# Patient Record
Sex: Female | Born: 1990 | Race: White | Marital: Single | Smoking: Current every day smoker
Health system: Southern US, Community
[De-identification: ages and names within clinical notes are randomized; demographics above are authoritative.]

## PROBLEM LIST (undated history)

## (undated) DIAGNOSIS — F988 Other specified behavioral and emotional disorders with onset usually occurring in childhood and adolescence: Secondary | ICD-10-CM

## (undated) DIAGNOSIS — O26892 Other specified pregnancy related conditions, second trimester: Secondary | ICD-10-CM

## (undated) DIAGNOSIS — R700 Elevated erythrocyte sedimentation rate: Secondary | ICD-10-CM

## (undated) DIAGNOSIS — G4700 Insomnia, unspecified: Secondary | ICD-10-CM

## (undated) DIAGNOSIS — F410 Panic disorder [episodic paroxysmal anxiety] without agoraphobia: Secondary | ICD-10-CM

## (undated) DIAGNOSIS — F419 Anxiety disorder, unspecified: Secondary | ICD-10-CM

## (undated) DIAGNOSIS — Z0182 Encounter for allergy testing: Secondary | ICD-10-CM

## (undated) DIAGNOSIS — Z77120 Contact with and (suspected) exposure to mold (toxic): Secondary | ICD-10-CM

## (undated) DIAGNOSIS — R4020 Unspecified coma: Secondary | ICD-10-CM

## (undated) DIAGNOSIS — K219 Gastro-esophageal reflux disease without esophagitis: Secondary | ICD-10-CM

---

## 2008-08-13 LAB — OB PROFILE BSHR
ABS. EOSINOPHILS: 0 10*3/uL (ref 0.0–0.4)
ABS. LYMPHOCYTES: 1.7 10*3/uL (ref 0.8–3.5)
ABS. MONOCYTES: 0.4 10*3/uL (ref 0–1.0)
ABS. NEUTROPHILS: 5.3 10*3/uL (ref 1.8–8.0)
BASOPHILS: 0 % (ref 0–3)
EOSINOPHILS: 1 % (ref 0–5)
HCT: 35.3 % — ABNORMAL LOW (ref 36.0–46.0)
HGB: 11.9 g/dL — ABNORMAL LOW (ref 12.0–16.0)
Hep B surface Ag Interp.: NEGATIVE
Hepatitis B surface Ag: <0.1 Index (ref <1.00)
LYMPHOCYTES: 23 % (ref 20–51)
MCH: 31 PG (ref 25.0–35.0)
MCHC: 33.8 g/dL (ref 31.0–37.0)
MCV: 91.6 FL (ref 78.0–102.0)
MONOCYTES: 6 % (ref 2–9)
MPV: 8.5 FL (ref 7.4–10.4)
NEUTROPHILS: 70 % (ref 42–75)
PLATELET: 212 10*3/uL (ref 130–400)
RBC: 3.86 M/uL — ABNORMAL LOW (ref 4.10–5.10)
RDW: 14.7 % — ABNORMAL HIGH (ref 11.5–14.5)
RPR: NONREACTIVE
Rubella IgG, QL: IMMUNE
WBC: 7.5 10*3/uL (ref 4.5–13.0)

## 2008-08-13 LAB — OB BLOOD BANK
ABO/Rh(D): O POS
Antibody screen: NEGATIVE

## 2008-08-13 LAB — CHLAMYDIA/GC PCR
Chlamydia amplified: NEGATIVE
N. gonorrhea, amplified: NEGATIVE

## 2008-08-14 LAB — CULTURE, URINE: Culture result:: 80000

## 2008-09-23 LAB — DRUG SCREEN, URINE
AMPHETAMINES: NEGATIVE
BARBITURATES: POSITIVE — AB
BENZODIAZEPINES: NEGATIVE
COCAINE: NEGATIVE
METHADONE: NEGATIVE
OPIATES: NEGATIVE
PCP(PHENCYCLIDINE): NEGATIVE
THC (TH-CANNABINOL): NEGATIVE

## 2008-10-10 LAB — STREP THROAT SCREEN: Strep Screen: NEGATIVE

## 2008-11-09 LAB — URINALYSIS W/ RFLX MICROSCOPIC
Bilirubin: NEGATIVE
Blood: NEGATIVE
Glucose: NEGATIVE MG/DL
Ketone: NEGATIVE MG/DL
Nitrites: NEGATIVE
Protein: NEGATIVE MG/DL
Specific gravity: 1.02 (ref 1.003–1.030)
Urobilinogen: 0.2 EU/DL (ref 0.2–1.0)
pH (UA): 7 (ref 5.0–8.0)

## 2008-11-09 LAB — URINE MICROSCOPIC ONLY
RBC: 0 /HPF (ref 0–5)
WBC: 1 /HPF (ref 0–4)

## 2008-11-11 LAB — CHLAMYDIA/GC PCR
Chlamydia amplified: NEGATIVE
N. gonorrhea, amplified: NEGATIVE

## 2008-11-25 LAB — POC URINE MACROSCOPIC
Bilirubin (POC): NEGATIVE
Blood (POC): NEGATIVE
Glucose, urine (POC): NEGATIVE mg/dL
Ketones (POC): NEGATIVE mg/dL
Leukocyte esterase (POC): NEGATIVE
Nitrite (POC): NEGATIVE
Protein (POC): 100 mg/dL — AB
Spec. gravity (POC): 1.025 (ref 1.003–1.030)
Urobilinogen (POC): 1 EU/dL (ref 0.2–1.0)
pH, urine  (POC): 7.5 (ref 5.0–8.0)

## 2008-11-25 LAB — CBC WITH AUTOMATED DIFF
ABS. EOSINOPHILS: 0 10*3/uL (ref 0.0–0.4)
ABS. LYMPHOCYTES: 2 10*3/uL (ref 0.8–3.5)
ABS. MONOCYTES: 0.6 10*3/uL (ref 0–1.0)
ABS. NEUTROPHILS: 4.2 10*3/uL (ref 1.8–8.0)
BASOPHILS: 0 % (ref 0–3)
EOSINOPHILS: 1 % (ref 0–5)
HCT: 32.1 % — ABNORMAL LOW (ref 36.0–46.0)
HGB: 11.1 g/dL — ABNORMAL LOW (ref 12.0–16.0)
LYMPHOCYTES: 29 % (ref 20–51)
MCH: 31.7 PG (ref 25.0–35.0)
MCHC: 34.5 g/dL (ref 31.0–37.0)
MCV: 91.8 FL (ref 78.0–102.0)
MONOCYTES: 9 % (ref 2–9)
MPV: 8.3 FL (ref 7.4–10.4)
NEUTROPHILS: 61 % (ref 42–75)
PLATELET: 136 10*3/uL (ref 130–400)
RBC: 3.49 M/uL — ABNORMAL LOW (ref 4.10–5.10)
RDW: 14.3 % (ref 11.5–14.5)
WBC: 6.9 10*3/uL (ref 4.5–13.0)

## 2008-11-25 LAB — METABOLIC PANEL, COMPREHENSIVE
A-G Ratio: 0.6 — ABNORMAL LOW (ref 0.8–1.7)
ALT (SGPT): 28 U/L — ABNORMAL LOW (ref 30–65)
AST (SGOT): 19 U/L (ref 15–37)
Albumin: 2.1 g/dL — ABNORMAL LOW (ref 3.4–5.0)
Alk. phosphatase: 143 U/L — ABNORMAL HIGH (ref 50–136)
Anion gap: 5 mmol/L (ref 5–15)
BUN/Creatinine ratio: 11 — ABNORMAL LOW (ref 12–20)
BUN: 10 MG/DL (ref 7–18)
Bilirubin, total: 0.2 MG/DL (ref 0.1–0.9)
CO2: 25 MMOL/L (ref 21–32)
Calcium: 8.2 MG/DL — ABNORMAL LOW (ref 8.4–10.4)
Chloride: 108 MMOL/L (ref 100–108)
Creatinine: 0.9 MG/DL (ref 0.6–1.3)
GFR est AA: >60 mL/min/{1.73_m2} (ref >60)
GFR est non-AA: >60 mL/min/{1.73_m2} (ref >60)
Globulin: 3.5 g/dL (ref 2.0–4.0)
Glucose: 75 MG/DL (ref 74–99)
Potassium: 4.1 MMOL/L (ref 3.5–5.5)
Protein, total: 5.6 g/dL — ABNORMAL LOW (ref 6.4–8.2)
Sodium: 138 MMOL/L (ref 136–145)

## 2008-11-25 LAB — AMYLASE: Amylase: 39 U/L (ref 25–115)

## 2008-11-25 LAB — DRUG SCREEN UR - NO CONFIRM
ACETAMINOPHEN: NEGATIVE
AMPHETAMINES: NEGATIVE
BARBITURATES: NEGATIVE
BENZODIAZEPINES: NEGATIVE
COCAINE: NEGATIVE
METHADONE: NEGATIVE
Methamphetamines: NEGATIVE
OPIATES: NEGATIVE
PCP(PHENCYCLIDINE): NEGATIVE
THC (TH-CANNABINOL): POSITIVE — AB
TRICYCLICS: NEGATIVE

## 2008-11-25 LAB — TROPONIN I: Troponin-I, QT: <0.04 NG/ML (ref 0.00–0.08)

## 2008-11-25 LAB — D DIMER: D DIMER: 0.92 ug/ml(FEU) — ABNORMAL HIGH (ref <0.46)

## 2008-11-25 LAB — LIPASE: Lipase: 115 U/L (ref 73–393)

## 2008-11-25 LAB — URIC ACID: Uric acid: 5.5 MG/DL (ref 2.6–7.2)

## 2008-11-25 LAB — LD: LD: 134 U/L (ref 100–190)

## 2008-12-01 NOTE — ED Provider Notes (Signed)
[facility]                      EMERGENCY DEPARTMENT TREATMENT REPORT   NAME:  Lacey [name], Lacey [name]               SEX:            F   DATE:  12/01/2008                     DOB:            10-May-1990   MR#    [id number]                       TIME SEEN        6:42 A   ACCT#  [id number]                      ROOM:           [id number]   cc:    Hubert Azure, M.D.   PRIMARY CARE PHYSICIAN   Lynelle Smoke, MD   CHIEF COMPLAINT   Possible seizure.   HISTORY OF PRESENT ILLNESS   [age]-year-old female who is 6 months pregnant presents to the emergency   department by EMS.  EMS states that they received 2 calls to the patient's   trailer tonight.   The first was a third party call from her mother who was   not present on their arrival.  The mother apparently was concerned that the   patient overdosed.  When they arrived the patient stated she was taking   Vicodin and Motrin as prescribed by her doctor for costochondritis and did   not want to go with them. They were called back tonight by her.  She called   because she was vomiting.  The patient also tells me that she has had a   headache all day.  Her blood pressure on their arrival was 165/109 with no   history of hypertension.  Apparently when they were helping her out to the   ambulance, they state that she had about 1 minute of what they described as   seizure activity after falling to the ground, but there is no postictal   phase.  No history of seizures.   REVIEW OF SYSTEMS   CONSTITUTIONAL:  No fever.   EYES: No visual symptoms.   ENT: No sore throat, runny nose or other URI symptoms.   RESPIRATORY:  Has a cough.   CARDIOVASCULAR:  States that her chest is sore.   GASTROINTESTINAL:  Vomiting.  No diarrhea, no abdominal pain.   GENITOURINARY:  No dysuria, frequency, or urgency.   MUSCULOSKELETAL:  No joint pain or swelling.   INTEGUMENTARY:  No rashes.   NEUROLOGICAL:  Headache, questionable seizure.   PAST MEDICAL HISTORY    Bipolar, ADHD.   SOCIAL HISTORY   Smokes.   FAMILY HISTORY   Noncontributory.   ALLERGIES   None.   MEDICATIONS   Vicodin.   PHYSICAL EXAMINATION   VITALS:  Blood pressure 147/93, pulse 74, respiratory rate 11, temperature   97.8, O2 saturation 99% on room air, pain 10/10.   GENERAL APPEARANCE:  The patient appears well developed and well nourished.   Appearance and behavior are age and situation appropriate.   HEENT:  Eyes:  Conjunctivae clear, lids normal.  Pupils equal, symmetrical,   and normally reactive. Mouth/Throat:  Surfaces of the pharynx, palate, and   tongue are pink, moist, and without lesions.   NECK:  Supple, symmetrical, trachea midline.   LYMPHATIC:  No cervical or submandibular lymphadenopathy palpated.   RESPIRATORY:  Clear and equal breath sounds.  No respiratory distress,   tachypnea, or accessory muscle use.   CARDIOVASCULAR:  Regular rate and rhythm.   GASTROINTESTINAL:  Abdomen soft, nontender, without complaint of pain to   palpation.  No hepatomegaly or splenomegaly.   MUSCULOSKELETAL:  Nails:  No clubbing or deformities.  Nail beds are pink   with prompt capillary refill.   SKIN:  Warm and dry without rashes.   PSYCHIATRIC:  Recent and remote memory appear to be intact.   NEUROLOGICAL:  No focal deficits.   CONTINUATION BY DR. Jorja Loa NELSON   DIAGNOSTIC IMPRESSION   I saw the patient Physician's Assistant Ms. Woolard.  The patient was seen   in [facility] Emergency Department the 27th and the 30th, had a gallbladder   ultrasound, a CT scan of her chest and a cardiology consultation on the   first visit.  Today, EMS was activated because she had been vomiting and   complaining of headache.  On arrival, she was hypertensive, was concerned   about eclampsia.  There are some questions  whether she may have had a   seizure prehospital, although there is no documented postictal state.   Paramedics described about a minute of twitching activity.  Once here in    the emergency department, she proceeded to have another seizure that lasted   for about a minute or less.  She had generalized tonic-clonic activity.   She was, indeed, postictal after the seizure.  EMS had been at her house   earlier in the day because her mother thought she might have overdosed on   her pain medicine and the patient requests we do a Tylenol level to prove   that she has not overdosed.  Her salicylate level was less than 1.7, which   is negative.  Alcohol screen is negative.  Acetaminophen is 2.2 mcg per mL,   which is quite subtherapeutic.  Lipase is normal.  Comprehensive Metabolic   Panel reveals normal SGPT and SGOT.  Her alk phos is slightly elevated at   149, LDH is, likewise, slightly elevated at 311.  Urinalysis has greater   than 300 protein, large blood, positive nitrites.  Urine culture is   pending.  Urine drug screen is positive for opiates, benzodiazepines and   marijuana as well as acetaminophen.  Urine microscopy shows 1-4 white   cells, too numerous to count red cells and 1+ bacteria; a culture is   pending.  I gave the patient 4 grams of magnesium over 30 minutes.   Concerned with proteinuria, elevated blood pressure, seizure and elevated   LDH that this might have become eclampsia.  I spoke with Dr. Maryruth Hancock as   well as the hospitalist, Dr. Shauna Hugh.  Dr. Maryruth Hancock agrees to accept   the patient.  Dr. Sherlon Handing will be evaluating as a consultant's role.   DIAGNOSES   1. Eclampsia   2. Cephalgia.   3. Nausea and vomiting.   PLAN   Admission to the antepartum unit on Dr. Isabel Caprice service.   ADDENDUM BY DR. TODD VANDEN HOEK   0913 hours.   The patient was admitted to the hospitalist service with Dr. Maryruth Hancock on   consult.  The head CT over read by   Dr. Van Clines  showed areas of hyperattenuation involving the white matter   of both posterior parietal lobes, greater on the right.  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  Consider MRI for additional evaluation.  This report was   discussed with Dr. Leighton Roach on the hospitalist service who will relate to the   hospitalist taking care of this patient.   ____________________________   Marijo Sanes, M.D.   Dictated By:  Salem Caster, P.A.   My signature above authenticates this document and my orders, the final   diagnosis(es), discharge prescription(s) and instructions in the [facility] record.   sb1  D:  12/01/2008  T:  12/01/2008  8:06 A   [id number]

## 2008-12-01 NOTE — Consults (Signed)
[facility]                               CONSULTATION REPORT                     CONSULTANT:  ANNA Blenda Nicely, M.D.   NAMENeita [name], [place]                      CONSULT  DATE:       12/01/2008   ADMIT 12/01/2008                            SEX:                 F   DATE:   MR#   [id number]                              DOB:                 12/18/1990   ACCT# [id number]                             LOCATION:            [id number]   cc:    Hubert Azure, M.D.          ANNA Blenda Nicely, M.D.   REASON FOR CONSULTATION   This patient is a 18-year Caucasian lady who is [redacted] weeks pregnant.  She   presented to the emergency room because of persistent nausea and vomiting.   The patient was found to be hypertensive with proteinuria and had two   witnessed generalized tonic-clonic seizures, one witnessed by paramedics on   the way here and one witnessed at the emergency room, seen by Dr. Delton See.   The patient has a questionable urinary tract infection and also has a urine   drug screen which is positive for benzos, opiates and cannabis.  The   patient is being admitted under the OB/GYN service and we are following for   medical management.   PROBLEMS IDENTIFIED   1. Hypertension which is uncontrolled but apparently new.   2. Generalized tonic-clonic seizures.   3. Proteinuria.   4. Urinary tract infection.   5. Nausea, vomiting.   6. Moderate malnutrition.   7. Mild leukocytosis.   8. History of herpes.   9. History of depression   10. History of drug abuse.  She denies any others except for marijuana.      She used to smoke but quit since she became pregnant.  She denies any      alcohol use.   CURRENT RECOMMENDATIONS   Would treat the hypertension as her blood pressures have been normal in the   past and this is all new.  Her blood pressures have been ranging in the   160s systolic and 100s diastolic.  Would defer to OB/GYN as to the choice    of hypertensive medications.  Would also treat the underlying possible   urinary tract infection and follow cultures.  Adjust antibiotics as needed.   I would recommend amoxicillin or Keflex.  Would treat this nausea and   vomiting symptomatically.  Will defer to OB/GYN regarding management of the   seizures accompanying the  eclampsia.   The patient had already been   counseled on smoking cessation and the use of marijuana.  She said she has   not smoked since she became pregnant, but the urine drug screen is positive   for the drug.  Would recommend sequential TEDs for DVT prophylaxis and   recommend to continue the antidepressant as the patient seems to be at high   risk for significant depression.   HISTORY OF PRESENT ILLNESS   This is a 18 year old Caucasian lady who is [redacted] weeks pregnant.  She had   just presented to [facility] a couple of days ago because of chest   pain that started upon waking up.  She was diagnosed there to have   costochondritis after workup consisting of a CT scan of the chest and   ultrasound of the right upper quadrant.  A cardiology consult was made.   All the tests apparently were negative.  She was sent home on Vicodin and   was also given Xanax by her primary care doctor.  The patient was not very   clear as to what happened in between, but on the day of admission here, she   was noted by some neighbors to be vomiting a lot.  Paramedics were summoned   and she was sent over here.  Based on the paramedic's note, there was a   question of whether she had been overdosing on drugs.  Apparently when they   saw her, she was walking to the ambulance when she had a generalized   tonic-clonic seizure that was witnessed by the paramedics.  It was not   clear how long the postictal phase was, but apparently the patient had   recovered her baseline mental status by the time she arrived at the ER.   She was being evaluated here when she had another episode of generalized    tonic-clonic seizures, this time witnessed by Dr. Marijo Sanes who was   the emergency room physician.  She was given some magnesium and the patient   is being admitted now to OB/GYN with [place] in consult.  The patient denied   having any history of seizures before.  She said she felt a little bit   feverish for the past day or two and a little bit chilly.  She denied   having any dysuria until the day she went to [facility], but she cannot   remember if she was catheterized there either.  She denied having any   nausea and vomiting until today.  She does have some low back pain but that   is chronic since she became pregnant.  She says she has not been sexually   active since she became pregnant.  She denies any vaginal discharge.   REVIEW OF SYSTEMS   A 12-point review of systems was done and is essentially negative except   for the above-mentioned symptoms.   ALLERGIES   No known drug allergies.   IMMUNIZATION HISTORY   None.   PAST MEDICAL HISTORY   See above list.   MEDICATIONS   She apparently is on Zoloft and BuSpar, but she is not sure if she still   taking that.  She definitely is taking Vicodin 5/325 mg 1 tablet every 4   hours as needed for pain and prenatal vitamins which she admits she does   not take on a regular basis.   She also takes Xanax as needed for anxiety.   FAMILY  HISTORY   Her father just recently committed suicide.  Her mother is alive but has a   history of toxemia of pregnancy according to the patient.  She also has   hypertension.   SOCIAL HISTORY   She is single.  She currently lives with a roommate.  She plans to attend   school in the fall.  Her mother is basically her main source of support as   she is unemployed.   PHYSICAL EXAMINATION   GENERAL:  She is an obese young Caucasian lady not in apparent distress.   VITAL SIGNS:  Blood pressure 147/93, pulse rate 74, respiratory rate 11,   temperature 97.8 and O2 sats 99% on room air.    HEENT:  Normocephalic, atraumatic.  Pink conjunctivae, anicteric sclerae.   Normal ears and nose.  Clear oropharynx.  No thrush.   NECK:  Neck is supple.  No JVD, thyromegaly or bruit.   CHEST AND LUNGS:  Symmetric expansion.  Clear to auscultation.   HEART:  Normal rate, regular rhythm.  No murmur, gallop or rub.   ABDOMEN:  Gravid.  Positive bowel sounds, soft, no guarding or rebound.  No   tenderness.   EXTREMITIES:  She has trace ankle edema but no leg tenderness and no   cyanosis or clubbing.   SKIN:  She has multiple tattoos but no rashes.  No cyanosis.   NEUROLOGIC:  She is alert and oriented x3, moving all four extremities.   Appears to have intact gross sensation.   LABORATORY DATA   Her BMP is remarkable for an alkaline phosphatase 149, albumin of 2.5,   total protein of 6 and CO2 of 20.  Creatinine is normal.  CBC shows a white   cell count of 5.7 with a normal hemoglobin and platelet count.  Urinalysis   shows 100 glucose, a moderate amount of bilirubin, 15 ketones, a large   amount of blood, protein greater than 300 and nitrites were positive.  This   was from a catheterized specimen.  Her urine drug screen was positive for   opiates, benzodiazepines, marijuana and acetaminophen.  The urine also   showed some waxy casts.  A head CT does not show any acute abnormality.   ASSESSMENT/PLAN   As above.   RECOMMENDATIONS   As above.   Thank you very much Dr. Maryruth Hancock for allowing [place] to participate in her care.   Our group will follow with you.   ____________________________   Barrie Folk, M.D.   Dictated By:   lo  D:  12/01/2008  T:  12/01/2008  3:16 P   [id number]

## 2008-12-09 NOTE — ED Provider Notes (Signed)
[facility]                      EMERGENCY DEPARTMENT TREATMENT REPORT   NAME:  [place], [place]               SEX:            F   DATE:  12/01/2008                     DOB:            1990/12/30   MR#    [id number]                       TIME SEEN        9:39 P   ACCT#  [id number]                      ROOM:           [id number]   cc:                                    ADDENDUM   ADDENDUM BY Marijo Sanes, M.D.   PROCEDURE   Critical care time, excluding procedures, but  including direct patient   care, reviewing medical record, evaluating results of diagnostic testing,   discussions with family members, and consulting with physicians:  40   minutes.   ____________________________   Marijo Sanes, M.D.   Dictated By:   My signature above authenticates this document and my orders, the final   diagnosis(es), discharge prescription(s) and instructions in the [facility] record.   pb  D:  12/09/2008  T:  12/09/2008 10:30 P   [id number]

## 2008-12-13 NOTE — ED Provider Notes (Signed)
[facility]                      EMERGENCY DEPARTMENT TREATMENT REPORT   NAME:  [place], [place]               SEX:            F   DATE:  12/13/2008                     DOB:            1991/01/15   MR#    [id number]                       TIME SEEN        5:34 A   ACCT#  [id number]                      ROOM:           ER  [id number]   cc:   Time seen: 1610   CHIEF COMPLAINT   Headache.   HISTORY OF PRESENT ILLNESS   This is a 18 year old female who presents to the emergency department   complaining of headache.  She delivered early on December 01, 2008 after being   admitted for preeclampsia.  She was [redacted] week gestation.  She states that she   was discharged from the hospital on Saturday and has been having headache   since that time, but this one is slightly worse than the one she has been   experiencing.  The headache is right temporal and frontal.  She states that   she has been missing doses of her blood pressure medications, which are   labetalol and nifedipine.  She did have an epidural with her delivery but   has not complained of a positional change with her headache intensity.   REVIEW OF SYSTEMS   CONSTITUTIONAL: No fever.   EYES: No visual symptoms.   ENT: No sore throat, runny nose or other URI symptoms.   RESPIRATORY:  No cough, shortness of breath, or wheezing.   CARDIOVASCULAR:  No chest pain, chest pressure, or palpitations.   GASTROINTESTINAL:  No vomiting, diarrhea, or abdominal pain.   GENITOURINARY:  No dysuria, frequency, or urgency.   MUSCULOSKELETAL:  No joint pain or swelling.   INTEGUMENTARY:  No rashes.   NEUROLOGICAL: Headache.   PAST MEDICAL HISTORY   Eclampsia, cesarean section, bipolar, ADHD.   SOCIAL HISTORY   Here alone.  Smoker.   FAMILY HISTORY   Noncontributory.   ALLERGIES   None.   MEDICATIONS   Reviewed in [facility].   PHYSICAL EXAMINATION   VITAL SIGNS: Blood pressure 150/96, pulse 108, respiratory rate 16,   temperature 98.6, pain 10/10.    GENERAL APPEARANCE:  The patient appears well developed and well nourished.   Appearance and behavior are age and situation appropriate.   HEENT: Eyes:  Conjunctivae clear, lids normal.  Pupils equal, symmetrical,   and normally reactive. Mouth/Throat:  Surfaces of the pharynx, palate, and   tongue are pink, moist, and without lesions.   NECK:  Supple and symmetrical. Trachea midline.   LYMPHATICS:  No cervical or submandibular lymphadenopathy palpated.   RESPIRATORY:  Clear and equal breath sounds.  No respiratory distress,   tachypnea, or accessory muscle use.   HEART:   Regular rate and rhythm.   GASTROINTESTINAL:  Abdomen soft, nontender, without  complaint of pain to   palpation.  No hepatomegaly or splenomegaly.   MUSCULOSKELETAL:  Stance and gait appear normal.   SKIN:  Warm and dry without rashes.   PSYCHIATRIC:   Recent and remote memory appear to be intact.   NEUROLOGICAL: No focal deficits.   CONTINUATION BY Salem Caster, PA-C:   INITIAL ASSESSMENT AND MANAGEMENT PLAN   A 18 year old female presents with complaints of headache.  She states that   she has been having them since she came home from the hospital after her   emergency C-section for eclampsia.  We are going to check liver enzymes and   urinalysis for protein.  We will medicate with Compazine and Benadryl.   DIAGNOSTIC STUDIES   Urinalysis with greater than 300 protein, but she does have small blood in   her urine.  Her CMP is unremarkable with no liver enzyme elevation.  Her   CBC is unremarkable save mild anemia with a hemoglobin of 11 and hematocrit   32.5.   COURSE IN THE EMERGENCY DEPARTMENT   Recheck after Compazine and Benadryl, she still had a slight headache.  She   was given 4 mg of morphine and 4 mg of Zofran.  At this time, on recheck,   she is improved.  Blood pressure is 131/71.   DIAGNOSIS   Cephalgia.   PLAN   1. The patient is discharged home in stable condition, with instructions to       follow up with their regular doctor.  They are advised to return      immediately for any worsening or symptoms of concern.   2. Prescription written for Fioricet and Phenergan.  The patient is also      advised to take her blood pressure medications as prescribed.  May      return if new or worsening symptoms.   ____________________________   Imogene Burn, M.D.   Dictated By:  Salem Caster, P.A.   My signature above authenticates this document and my orders, the final   diagnosis(es), discharge prescription(s) and instructions in the [facility] record.   gm  D:  12/13/2008  T:  12/15/2008 11:18 A   [id number]

## 2008-12-31 LAB — CULTURE, BODY FLUID W GRAM STAIN
Culture result:: NO GROWTH
Culture result:: NO GROWTH

## 2009-07-22 LAB — HCG QL SERUM: HCG, Ql.: NEGATIVE

## 2009-07-24 LAB — CBC WITH AUTOMATED DIFF
ABS. BASOPHILS: 0 10*3/uL (ref 0.0–0.1)
ABS. EOSINOPHILS: 0 10*3/uL (ref 0.0–0.4)
ABS. LYMPHOCYTES: 1.4 10*3/uL (ref 0.8–3.5)
ABS. MONOCYTES: 0.7 10*3/uL (ref 0–1.0)
ABS. NEUTROPHILS: 4.7 10*3/uL (ref 1.8–8.0)
BASOPHILS: 0 % (ref 0–3)
EOSINOPHILS: 1 % (ref 0–5)
HCT: 33.7 % — ABNORMAL LOW (ref 36.0–46.0)
HGB: 10.9 g/dL — ABNORMAL LOW (ref 12.0–16.0)
LYMPHOCYTES: 21 % (ref 20–51)
MCH: 28.3 PG (ref 25.0–35.0)
MCHC: 32.4 g/dL (ref 31.0–37.0)
MCV: 87.2 FL (ref 78.0–102.0)
MONOCYTES: 11 % — ABNORMAL HIGH (ref 2–9)
MPV: 8.9 FL (ref 7.4–10.4)
NEUTROPHILS: 67 % (ref 42–75)
PLATELET: 178 10*3/uL (ref 130–400)
RBC: 3.86 M/uL — ABNORMAL LOW (ref 4.10–5.10)
RDW: 15.6 % — ABNORMAL HIGH (ref 11.5–14.5)
WBC: 6.8 10*3/uL (ref 4.5–13.0)

## 2009-07-24 LAB — INFLUENZA A & B AG (RAPID TEST)
Influenza A Antigen: NEGATIVE
Influenza B Antigen: NEGATIVE

## 2009-07-24 LAB — METABOLIC PANEL, BASIC
Anion gap: 11 mmol/L (ref 5–15)
BUN/Creatinine ratio: 13 (ref 12–20)
BUN: 13 MG/DL (ref 7–18)
CO2: 27 MMOL/L (ref 21–32)
Calcium: 9 MG/DL (ref 8.4–10.4)
Chloride: 105 MMOL/L (ref 100–108)
Creatinine: 1 MG/DL (ref 0.6–1.3)
GFR est AA: >60 mL/min/{1.73_m2} (ref >60)
GFR est non-AA: >60 mL/min/{1.73_m2} (ref >60)
Glucose: 78 MG/DL (ref 74–99)
Potassium: 4 MMOL/L (ref 3.5–5.5)
Sodium: 143 MMOL/L (ref 130–146)

## 2009-07-24 LAB — MONONUCLEOSIS SCREEN: Mononucleosis screen: NEGATIVE

## 2009-07-24 LAB — STREP THROAT SCREEN: Strep Screen: NEGATIVE

## 2009-09-11 LAB — CBC WITH AUTOMATED DIFF
ABS. BASOPHILS: 0 10*3/uL (ref 0.0–0.06)
ABS. EOSINOPHILS: 0.1 10*3/uL (ref 0.0–0.4)
ABS. LYMPHOCYTES: 2.7 10*3/uL (ref 0.9–3.6)
ABS. MONOCYTES: 0.5 10*3/uL (ref 0.05–1.2)
ABS. NEUTROPHILS: 3 10*3/uL (ref 1.8–8.0)
BASOPHILS: 1 % (ref 0–2)
EOSINOPHILS: 1 % (ref 0–5)
HCT: 35.8 % (ref 35.0–45.0)
HGB: 11.5 g/dL — ABNORMAL LOW (ref 12.0–16.0)
LYMPHOCYTES: 43 % (ref 21–52)
MCH: 27.4 PG (ref 24.0–34.0)
MCHC: 32.1 g/dL (ref 31.0–37.0)
MCV: 85.2 FL (ref 74.0–97.0)
MONOCYTES: 8 % (ref 3–10)
MPV: 10.2 FL (ref 9.2–11.8)
NEUTROPHILS: 47 % (ref 40–73)
PLATELET: 237 10*3/uL (ref 135–420)
RBC: 4.2 M/uL (ref 4.20–5.30)
RDW: 15.7 % — ABNORMAL HIGH (ref 11.6–14.5)
WBC: 6.2 10*3/uL (ref 4.6–13.2)

## 2009-11-15 LAB — DRUG SCREEN UR - NO CONFIRM
ACETAMINOPHEN: POSITIVE — AB
AMPHETAMINES: NEGATIVE
BARBITURATES: NEGATIVE
BENZODIAZEPINES: NEGATIVE
COCAINE: NEGATIVE
METHADONE: NEGATIVE
Methamphetamines: NEGATIVE
OPIATES: NEGATIVE
PCP(PHENCYCLIDINE): NEGATIVE
THC (TH-CANNABINOL): NEGATIVE
TRICYCLICS: NEGATIVE

## 2009-11-15 LAB — URINALYSIS W/ RFLX MICROSCOPIC
Bilirubin: NEGATIVE
Blood: NEGATIVE
Glucose: NEGATIVE MG/DL
Ketone: NEGATIVE MG/DL
Leukocyte Esterase: NEGATIVE
Nitrites: NEGATIVE
Protein: NEGATIVE MG/DL
Specific gravity: 1.025 (ref 1.003–1.030)
Urobilinogen: 0.2 EU/DL (ref 0.2–1.0)
pH (UA): 8 (ref 5.0–8.0)

## 2009-11-15 LAB — HCG URINE, QL: HCG urine, QL: NEGATIVE

## 2010-05-06 LAB — URINALYSIS W/ RFLX MICROSCOPIC
Bilirubin: NEGATIVE
Blood: NEGATIVE
Glucose: NEGATIVE MG/DL
Ketone: NEGATIVE MG/DL
Leukocyte Esterase: NEGATIVE
Nitrites: NEGATIVE
Protein: NEGATIVE MG/DL
Specific gravity: 1.025 (ref 1.003–1.030)
Urobilinogen: 0.2 EU/DL (ref 0.2–1.0)
pH (UA): 7.5 (ref 5.0–8.0)

## 2010-05-06 LAB — KOH, OTHER SOURCES: KOH: NONE SEEN

## 2010-05-06 LAB — WET PREP

## 2010-05-06 LAB — BETA HCG, QT: Beta HCG, QT: <1 m[IU]/mL — ABNORMAL LOW (ref 1–6)

## 2010-05-06 LAB — HCG URINE, QL: HCG urine, QL: NEGATIVE

## 2010-05-07 LAB — CHLAMYDIA/GC PCR
Chlamydia amplified: NEGATIVE
N. gonorrhea, amplified: NEGATIVE

## 2014-07-02 ENCOUNTER — Ambulatory Visit: Payer: Self-pay | Admitting: Family Medicine

## 2014-07-25 ENCOUNTER — Emergency Department: Payer: Self-pay | Admitting: Internal Medicine

## 2014-07-25 LAB — COMPREHENSIVE METABOLIC PANEL
ANION GAP: 7 (ref 7–16)
Albumin: 4.2 g/dL
Alkaline Phosphatase: 48 U/L
BILIRUBIN TOTAL: 0.3 mg/dL
BUN: 17 mg/dL
CREATININE: 0.96 mg/dL
Calcium, Total: 9.1 mg/dL
Chloride: 102 mmol/L
Co2: 31 mmol/L
EGFR (African American): >60
GLUCOSE: 89 mg/dL
POTASSIUM: 4 mmol/L
SGOT(AST): 18 U/L
SGPT (ALT): 12 U/L — ABNORMAL LOW
SODIUM: 140 mmol/L
Total Protein: 7.4 g/dL

## 2014-07-25 LAB — CBC WITH DIFFERENTIAL/PLATELET
BASOS ABS: 0 10*3/uL (ref 0.0–0.1)
BASOS PCT: 0.4 %
EOS PCT: 1.8 %
Eosinophil #: 0.1 10*3/uL (ref 0.0–0.7)
HCT: 40.2 % (ref 35.0–47.0)
HGB: 13.2 g/dL (ref 12.0–16.0)
LYMPHS PCT: 22.3 %
Lymphocyte #: 1.6 10*3/uL (ref 1.0–3.6)
MCH: 31.5 pg (ref 26.0–34.0)
MCHC: 32.9 g/dL (ref 32.0–36.0)
MCV: 96 fL (ref 80–100)
Monocyte #: 0.6 x10 3/mm (ref 0.2–0.9)
Monocyte %: 8 %
NEUTROS PCT: 67.5 %
Neutrophil #: 4.9 10*3/uL (ref 1.4–6.5)
Platelet: 228 10*3/uL (ref 150–440)
RBC: 4.2 10*6/uL (ref 3.80–5.20)
RDW: 14.7 % — ABNORMAL HIGH (ref 11.5–14.5)
WBC: 7.3 10*3/uL (ref 3.6–11.0)

## 2014-07-25 LAB — TROPONIN I: Troponin-I: <0.03 ng/mL

## 2014-12-05 ENCOUNTER — Emergency Department
Admission: EM | Admit: 2014-12-05 | Discharge: 2014-12-05 | Discharge status: Against Medical Advice | Payer: Medicaid Other

## 2014-12-05 NOTE — ED Notes (Signed)
Pt presents after sustaining injury to R upper arm at work after a box fell 4' to land on upper R arm. Pt has arm partially extended, elbow bent. Pt describes the sensation as both numb and painful. Pt has decided to return in a.m.

## 2014-12-24 ENCOUNTER — Emergency Department: Payer: Medicaid Other

## 2014-12-24 ENCOUNTER — Emergency Department
Admission: EM | Admit: 2014-12-24 | Discharge: 2014-12-24 | Disposition: A | Discharge status: Home / Self Care | Payer: Medicaid Other | Attending: Student | Admitting: Student

## 2014-12-24 DIAGNOSIS — R112 Nausea with vomiting, unspecified: Secondary | ICD-10-CM | POA: Diagnosis not present

## 2014-12-24 DIAGNOSIS — Z3202 Encounter for pregnancy test, result negative: Secondary | ICD-10-CM | POA: Insufficient documentation

## 2014-12-24 DIAGNOSIS — R1084 Generalized abdominal pain: Secondary | ICD-10-CM | POA: Diagnosis present

## 2014-12-24 DIAGNOSIS — R109 Unspecified abdominal pain: Secondary | ICD-10-CM

## 2014-12-24 DIAGNOSIS — R52 Pain, unspecified: Secondary | ICD-10-CM

## 2014-12-24 LAB — URINALYSIS COMPLETE WITH MICROSCOPIC (ARMC ONLY)
BACTERIA UA: NONE SEEN
GLUCOSE, UA: NEGATIVE mg/dL
Hgb urine dipstick: NEGATIVE
Leukocytes, UA: NEGATIVE
NITRITE: NEGATIVE
PROTEIN: 100 mg/dL — AB
Specific Gravity, Urine: 1.024 (ref 1.005–1.030)
pH: 8 (ref 5.0–8.0)

## 2014-12-24 LAB — BASIC METABOLIC PANEL
Anion gap: 11 (ref 5–15)
BUN: 15 mg/dL (ref 6–20)
CALCIUM: 9.3 mg/dL (ref 8.9–10.3)
CO2: 29 mmol/L (ref 22–32)
CREATININE: 1.21 mg/dL — AB (ref 0.44–1.00)
Chloride: 101 mmol/L (ref 101–111)
Glucose, Bld: 83 mg/dL (ref 65–99)
Potassium: 3.5 mmol/L (ref 3.5–5.1)
SODIUM: 141 mmol/L (ref 135–145)

## 2014-12-24 LAB — CBC WITH DIFFERENTIAL/PLATELET
BASOS ABS: 0.1 10*3/uL (ref 0–0.1)
Eosinophils Absolute: 0.1 10*3/uL (ref 0–0.7)
Eosinophils Relative: 2 %
HEMATOCRIT: 41.7 % (ref 35.0–47.0)
HEMOGLOBIN: 13.9 g/dL (ref 12.0–16.0)
Lymphs Abs: 2.7 10*3/uL (ref 1.0–3.6)
MCH: 31.2 pg (ref 26.0–34.0)
MCHC: 33.4 g/dL (ref 32.0–36.0)
MCV: 93.5 fL (ref 80.0–100.0)
Monocytes Absolute: 0.6 10*3/uL (ref 0.2–0.9)
Monocytes Relative: 7 %
NEUTROS ABS: 4.6 10*3/uL (ref 1.4–6.5)
Platelets: 240 10*3/uL (ref 150–440)
RBC: 4.46 MIL/uL (ref 3.80–5.20)
RDW: 13 % (ref 11.5–14.5)
WBC: 8.1 10*3/uL (ref 3.6–11.0)

## 2014-12-24 LAB — WET PREP, GENITAL
Clue Cells Wet Prep HPF POC: NONE SEEN
Trich, Wet Prep: NONE SEEN
YEAST WET PREP: NONE SEEN

## 2014-12-24 LAB — CHLAMYDIA/NGC RT PCR (ARMC ONLY)
Chlamydia Tr: NOT DETECTED
N GONORRHOEAE: NOT DETECTED

## 2014-12-24 LAB — HCG, QUANTITATIVE, PREGNANCY: hCG, Beta Chain, Quant, S: 1 m[IU]/mL (ref <5)

## 2014-12-24 LAB — TYPE AND SCREEN
ABO/RH(D): O POS
ANTIBODY SCREEN: NEGATIVE

## 2014-12-24 MED ORDER — SODIUM CHLORIDE 0.9 % IV BOLUS (SEPSIS)
1000.0000 mL | Freq: Once | INTRAVENOUS | Status: AC
  Administered 2014-12-24: 1000 mL via INTRAVENOUS

## 2014-12-24 NOTE — ED Provider Notes (Addendum)
[facility] Emergency Department Provider Note  ____________________________________________  Time seen: Approximately 6:23 PM  I have reviewed the triage vital signs and the nursing notes.   HISTORY  Chief Complaint No chief complaint on file.    HPI Dominique Lang is a 24 y.o. female with no chronic medical problems who presents for evaluation of diffuse abdomina cramping and vaginal spotting today. The patient reports that she is a G3 P2 at approximately 17 weeks estimated gestational age by last menstrual period. She reports that today she has had diffuse lower abdominal cramping. She had one episode of light spotting from her vagina which she noted when she wiped however this resolved. It was not enough to fill even a panty liner.  She had a few episodes of nonbloody nonbilious emesis today however tolerated [employer] in the waiting room and has been feeling better since arrival. She has not seen any OB/GYN this pregnancy. She reports that she was seen by a doctor 2 weeks ago and they told her that she had a negative pregnancy test however she reports that her "blood tests are always negative and they always have to do an ultrasound to know that I'm pregnant". She had an irregular period at the beginning of this month. No fevers or chills. No diarrhea. She reports that she is feeling a baby move in her abdomen just above her umbilicus.   No past medical history on file.  There are no active problems to display for this patient.   No past surgical history on file.  No current outpatient prescriptions on file.  Allergies Review of patient's allergies indicates no known allergies.  No family history on file.  Social History Social History  Substance Use Topics  . Smoking status: Not on file  . Smokeless tobacco: Not on file  . Alcohol Use: Not on file    Review of Systems Constitutional: No fever/chills Eyes: No visual changes. ENT:  No sore throat. Cardiovascular: Denies chest pain. Respiratory: Denies shortness of breath. Gastrointestinal: + abdominal pain.  + nausea, + vomiting.  No diarrhea.  No constipation. Genitourinary: Negative for dysuria. Musculoskeletal: Negative for back pain. Skin: Negative for rash. Neurological: Negative for headaches, focal weakness or numbness.  10-point ROS otherwise negative.  ____________________________________________   PHYSICAL EXAM:  Filed Vitals:   12/24/14 2022  BP: 119/79  Pulse: 88  Temp: 98.2 F (36.8 C)  TempSrc: Oral  SpO2: 95%     Constitutional: Alert and oriented. Well appearing and in no acute distress. Eyes: Conjunctivae are normal. PERRL. EOMI. Head: Atraumatic. Nose: No congestion/rhinnorhea. Mouth/Throat: Mucous membranes are moist.  Oropharynx non-erythematous. Neck: No stridor.   Cardiovascular: Normal rate, regular rhythm. Grossly normal heart sounds.  Good peripheral circulation. Respiratory: Normal respiratory effort.  No retractions. Lungs CTAB. Gastrointestinal: Soft and nontender. No distention. No abdominal bruits. No CVA tenderness. Pelvic: Thin white non-malodorous discharge from a closed os, no blood in the vaginal vault. No bimanual tenderness or cervical motion tenderness. Musculoskeletal: No lower extremity tenderness nor edema.  No joint effusions. Neurologic:  Normal speech and language. No gross focal neurologic deficits are appreciated. No gait instability. Skin:  Skin is warm, dry and intact. No rash noted. Psychiatric: Mood and affect are normal. Speech and behavior are normal.  ____________________________________________   LABS (all labs ordered are listed, but only abnormal results are displayed)  Labs Reviewed  WET PREP, GENITAL - Abnormal; Notable for the following:    WBC, Wet Prep HPF  POC RARE (*)    All other components within normal limits  BASIC METABOLIC PANEL - Abnormal; Notable for the following:     Creatinine, Ser 1.21 (*)    All other components within normal limits  URINALYSIS COMPLETEWITH MICROSCOPIC ([facility] ONLY) - Abnormal; Notable for the following:    Color, Urine YELLOW (*)    APPearance HAZY (*)    Bilirubin Urine 1+ (*)    Ketones, ur 1+ (*)    Protein, ur 100 (*)    Squamous Epithelial / LPF 6-30 (*)    All other components within normal limits  CHLAMYDIA/NGC RT PCR ([facility] ONLY)  CBC WITH DIFFERENTIAL/PLATELET  HCG, QUANTITATIVE, PREGNANCY  TYPE AND SCREEN  ABO/RH   ____________________________________________  EKG  none ____________________________________________  RADIOLOGY  Pelvic Ultrasound  FINDINGS: Uterus  Measurements: 8.3 x 3.7 x 4.7 cm. No fibroids or other mass visualized.  Endometrium  Thickness: 6.3 mm. No focal abnormality visualized.  Right ovary  Measurements: 3.7 x 2.1 x 2.1 cm. Normal appearance/no adnexal mass.  Left ovary  Measurements: 2.6 x 1.6 x 2 cm. Normal appearance/no adnexal mass.  Other findings  No free fluid.  IMPRESSION: Normal pelvic ultrasound. ____________________________________________   PROCEDURES  Procedure(s) performed: None  Critical Care performed: No  ____________________________________________   INITIAL IMPRESSION / ASSESSMENT AND PLAN / ED COURSE  Pertinent labs & imaging results that were available during my care of the patient were reviewed by me and considered in my medical decision making (see chart for details).  Dominique Flowers Ritisha Lang is a 24 y.o. female with no chronic medical problems who presents for evaluation of diffuse abdomina cramping and vaginal spotting today. On exam, she is very well-appearing and in no acute distress. Vital signs stable, she is afebrile. She has benign abdominal examination but is persistent about feeling "something move" just above the umbilicus. She has no abdominal tenderness. Creatinine slightly elevated at 1.21, suspect mild  dehydration, we'll give IV fluids. CBC unremarkable. Beta hCG is 1 making pregnancy unlikely however she reports that her pregnancy hormone is never elevated in pregnancy so we'll proceed with pelvic ultrasound. Vomiting may be related to viral illness or food borne illness but seems to have resolved at this point and she appears well hydrated, is tolerating by mouth intake very well.  ----------------------------------------- 8:49 PM on 12/24/2014 -----------------------------------------  Beta hCG is 1 and pelvic ultrasound is unremarkable. I discussed with the patient that she is not pregnant at this time, her abdominal exam is benign,  her vomiting has resolved. I doubt any acute life-threatening intra-abdominal process in this patient. Wet prep negative. GC chlamydia pending. Urinalysis shows no evidence of infection. Normal CBC. Discussed return precautions, need for close PCP follow-up and she is comfortable with the discharge plan. ____________________________________________   FINAL CLINICAL IMPRESSION(S) / ED DIAGNOSES  Final diagnoses:  Pain  Abdominal cramping      Gayla Doss, MD 12/24/14 2051  Gayla Doss, MD 12/24/14 2055

## 2014-12-25 LAB — ABO/RH: ABO/RH(D): O POS

## 2014-12-30 ENCOUNTER — Ambulatory Visit: Payer: Self-pay | Admitting: Obstetrics and Gynecology

## 2015-02-12 ENCOUNTER — Other Ambulatory Visit: Payer: Self-pay | Admitting: Family Medicine

## 2015-02-12 DIAGNOSIS — R140 Abdominal distension (gaseous): Secondary | ICD-10-CM

## 2015-02-17 ENCOUNTER — Ambulatory Visit: Payer: Medicaid Other

## 2015-03-16 ENCOUNTER — Emergency Department
Admission: EM | Admit: 2015-03-16 | Discharge: 2015-03-16 | Disposition: A | Discharge status: Home / Self Care | Payer: Medicaid Other | Attending: Emergency Medicine | Admitting: Emergency Medicine

## 2015-03-16 ENCOUNTER — Encounter: Payer: Self-pay | Admitting: Emergency Medicine

## 2015-03-16 DIAGNOSIS — Y9389 Activity, other specified: Secondary | ICD-10-CM | POA: Diagnosis not present

## 2015-03-16 DIAGNOSIS — Y998 Other external cause status: Secondary | ICD-10-CM | POA: Insufficient documentation

## 2015-03-16 DIAGNOSIS — Y92000 Kitchen of unspecified non-institutional (private) residence as  the place of occurrence of the external cause: Secondary | ICD-10-CM | POA: Diagnosis not present

## 2015-03-16 DIAGNOSIS — W19XXXA Unspecified fall, initial encounter: Secondary | ICD-10-CM

## 2015-03-16 DIAGNOSIS — Z043 Encounter for examination and observation following other accident: Secondary | ICD-10-CM | POA: Diagnosis not present

## 2015-03-16 DIAGNOSIS — W01198A Fall on same level from slipping, tripping and stumbling with subsequent striking against other object, initial encounter: Secondary | ICD-10-CM | POA: Diagnosis not present

## 2015-03-16 LAB — POCT PREGNANCY, URINE: Preg Test, Ur: NEGATIVE

## 2015-03-16 NOTE — ED Notes (Signed)
MD at bedside.RN with pt

## 2015-03-16 NOTE — ED Provider Notes (Signed)
[facility] Emergency Department Provider Note    ____________________________________________  Time seen: 1530  I have reviewed the triage vital signs and the nursing notes.   HISTORY  Chief Complaint Fall   History limited by: Not Limited   HPI Veanna Dower Wessman is a 24 y.o. female who presents to the emergency department today because of concern for potential fetal injury after a fall yesterday. The patient states she was in the kitchen with her wife when her wife fell and the patient fell and hit her stomach when she went to catch her wife. She states that she is 28 weeks and 3 days pregnant according to when she was artificially inseminated. She states she has had abdominal swelling for the past 7 months. She denies having any prenatal care.   History reviewed. No pertinent past medical history.  There are no active problems to display for this patient.   History reviewed. No pertinent past surgical history.  No current outpatient prescriptions on file.  Allergies Review of patient's allergies indicates no known allergies.  History reviewed. No pertinent family history.  Social History Social History  Substance Use Topics  . Smoking status: Never Smoker   . Smokeless tobacco: None  . Alcohol Use: No    Review of Systems  Constitutional: Negative for fever. Cardiovascular: Negative for chest pain. Respiratory: Negative for shortness of breath. Gastrointestinal: Negative for abdominal pain, vomiting and diarrhea. Genitourinary: Negative for dysuria. Musculoskeletal: Negative for back pain. Skin: Negative for rash. Neurological: Negative for headaches, focal weakness or numbness.  10-point ROS otherwise negative.  ____________________________________________   PHYSICAL EXAM:  VITAL SIGNS: ED Triage Vitals  Enc Vitals Group     BP 03/16/15 1446 142/97 mmHg     Pulse Rate 03/16/15 1446 105     Resp 03/16/15 1446 20     Temp  03/16/15 1446 98.7 F (37.1 C)     Temp Source 03/16/15 1446 Oral     SpO2 03/16/15 1446 97 %     Weight 03/16/15 1446 168 lb (76.204 kg)     Height 03/16/15 1446  (1.626 m)     Head Cir --      Peak Flow --      Pain Score 03/16/15 1447 0   Constitutional: Alert and oriented. Well appearing and in no distress. Eyes: Conjunctivae are normal. PERRL. Normal extraocular movements. ENT   Head: Normocephalic and atraumatic.   Nose: No congestion/rhinnorhea.   Mouth/Throat: Mucous membranes are moist.   Neck: No stridor. Cardiovascular: Normal rate, regular rhythm.   Respiratory: Normal respiratory effort without tachypnea nor retractions.  Gastrointestinal: Soft and nontender. No distention. Nongravid Genitourinary: Deferred Musculoskeletal: Normal range of motion in all extremities. No joint effusions.  No lower extremity tenderness nor edema. Neurologic:  Normal speech and language. No gross focal neurologic deficits are appreciated.  Skin:  Skin is warm, dry and intact. No rash noted.   ____________________________________________    LABS (pertinent positives/negatives)  Labs Reviewed  POCT PREGNANCY, URINE     ____________________________________________   EKG  None  ____________________________________________    RADIOLOGY  Bedside ultrasound had a well-visualized empty uterus. No evidence of fetus or gestational sac.  ____________________________________________   PROCEDURES  Procedure(s) performed: None  Critical Care performed: No  ____________________________________________   INITIAL IMPRESSION / ASSESSMENT AND PLAN / ED COURSE  Pertinent labs & imaging results that were available during my care of the patient were reviewed by me and considered in my  medical decision making (see chart for details).  Patient presented to the emergency department today because of concerns for fetal injury after a fall. The patient claims to be 28  weeks and 3 days pregnant. Patient denies prenatal care. On exam patient had a nongravid abdomen. Additionally a urine pregnancy test was negative. Furthermore I did do a bedside ultrasound which was able to visualize the uterus and did not show any evidence of a current pregnancy. At this point I discussed with the patient that I did not see that she was pregnant. The patient became extremely agitated, however not at the fact that she was not pregnant more that there was no explanation why her abdomen was swelling for the past 7 months. She states she has seen her primary care doctor and is been evaluated here 5 times for this problem. Her abdomen was nontender. I do not appreciate any distention. I discussed with the patient importance of following up with her primary care doctor for this chronic issue. The patient then left the emergency department prior to my ability to prepare discharge paperwork.  ____________________________________________   FINAL CLINICAL IMPRESSION(S) / ED DIAGNOSES  Final diagnoses:  Fall, initial encounter   Nonpregnant  Phineas SemenGraydon Cassadie Pankonin, MD 03/16/15 1558

## 2015-03-16 NOTE — ED Notes (Signed)
Pt to ed with c/o fall on abd last night.  Pt states she is approximately [redacted] weeks pregnant.  Pt has negative urine for pregnancy.  Pt is tearful and states it has been a negative experience due to no one believing her that she is pregnant. Pt states " i want to be checked out for the fall"

## 2015-03-16 NOTE — ED Notes (Signed)
Dr. Derrill KayGoodman at bedside.  Patient examined and bedside US done by Dr. Derrill KayGoodman.  Patient tolerated exam well.  Patient became angry after examination when Dr. Derrill KayGoodman was discussing plan of care, which included follow up with PCP.  Patient stated that "no one cares about me..... You will be sorry when my stomach explodes and I die."  Emotional support given by Dr. Derrill KayGoodman and this RN.  Not well received.  Patient stormed out of ED without dischage instructions.

## 2015-05-19 ENCOUNTER — Emergency Department
Admission: EM | Admit: 2015-05-19 | Discharge: 2015-05-19 | Disposition: A | Discharge status: Home / Self Care | Payer: Medicaid Other | Attending: Emergency Medicine | Admitting: Emergency Medicine

## 2015-05-19 ENCOUNTER — Encounter: Payer: Self-pay | Admitting: Emergency Medicine

## 2015-05-19 DIAGNOSIS — Y9289 Other specified places as the place of occurrence of the external cause: Secondary | ICD-10-CM | POA: Insufficient documentation

## 2015-05-19 DIAGNOSIS — S51812A Laceration without foreign body of left forearm, initial encounter: Secondary | ICD-10-CM

## 2015-05-19 DIAGNOSIS — Y9389 Activity, other specified: Secondary | ICD-10-CM | POA: Insufficient documentation

## 2015-05-19 DIAGNOSIS — Z23 Encounter for immunization: Secondary | ICD-10-CM | POA: Diagnosis not present

## 2015-05-19 DIAGNOSIS — Y998 Other external cause status: Secondary | ICD-10-CM | POA: Insufficient documentation

## 2015-05-19 DIAGNOSIS — W268XXA Contact with other sharp object(s), not elsewhere classified, initial encounter: Secondary | ICD-10-CM | POA: Insufficient documentation

## 2015-05-19 HISTORY — DX: Anxiety disorder, unspecified: F41.9

## 2015-05-19 MED ORDER — TETANUS-DIPHTH-ACELL PERTUSSIS 5-2.5-18.5 LF-MCG/0.5 IM SUSP
0.5000 mL | Freq: Once | INTRAMUSCULAR | Status: AC
  Administered 2015-05-19: 0.5 mL via INTRAMUSCULAR
  Filled 2015-05-19: qty 0.5

## 2015-05-19 MED ORDER — LIDOCAINE-EPINEPHRINE (PF) 2 %-1:200000 IJ SOLN: 10.0000 mL | Freq: Once | INTRAMUSCULAR | Status: DC

## 2015-05-19 MED ORDER — LIDOCAINE HCL (PF) 1 % IJ SOLN
INTRAMUSCULAR | Status: AC
  Filled 2015-05-19: qty 5

## 2015-05-19 MED ORDER — ONDANSETRON 8 MG PO TBDP
ORAL_TABLET | ORAL | Status: AC
  Administered 2015-05-19: 8 mg via ORAL
  Filled 2015-05-19: qty 1

## 2015-05-19 MED ORDER — ONDANSETRON 8 MG PO TBDP
8.0000 mg | ORAL_TABLET | Freq: Once | ORAL | Status: AC
  Administered 2015-05-19: 8 mg via ORAL

## 2015-05-19 NOTE — ED Provider Notes (Signed)
[facility] Emergency Department Provider Note  ____________________________________________  Time seen: Approximately 5:44 PM  I have reviewed the triage vital signs and the nursing notes.   HISTORY  Chief Complaint Laceration    HPI Dominique Lang is a 25 y.o. female who presents emergency department complaining of a laceration to the left forearm. Patient states that her dog was caught in a metal dog kennel and she went to free it. She states that she cut the anterior portion of her left arm against the metal grate. Bleeding is controlled prior to arrival. She denies any numbness or tingling in the distal hand. She states the pain is minimal, she looks at it. She does not remember her last tetanus shot.   Past Medical History  Diagnosis Date  . Anxiety     There are no active problems to display for this patient.   History reviewed. No pertinent past surgical history.  No current outpatient prescriptions on file.  Allergies Review of patient's allergies indicates no known allergies.  No family history on file.  Social History Social History  Substance Use Topics  . Smoking status: Never Smoker   . Smokeless tobacco: None  . Alcohol Use: No     Review of Systems  Constitutional: No fever/chills Eyes: No visual changes. No discharge ENT: No sore throat. Cardiovascular: no chest pain. Respiratory: no cough. No SOB. Gastrointestinal: No abdominal pain.  No nausea, no vomiting.  No diarrhea.  No constipation. Genitourinary: Negative for dysuria. No hematuria Musculoskeletal: Negative for back pain. Skin: Negative for rash. Laceration to L forearm Neurological: Negative for headaches, focal weakness or numbness. 10-point ROS otherwise negative.  ____________________________________________   PHYSICAL EXAM:  VITAL SIGNS: ED Triage Vitals  Enc Vitals Group     BP 05/19/15 1727 155/84 mmHg     Pulse Rate 05/19/15 1727 102      Resp 05/19/15 1727 20     Temp 05/19/15 1727 99 F (37.2 C)     Temp Source 05/19/15 1727 Oral     SpO2 05/19/15 1727 99 %     Weight 05/19/15 1727 190 lb (86.183 kg)     Height 05/19/15 1727  (1.626 m)     Head Cir --      Peak Flow --      Pain Score 05/19/15 1713 6     Pain Loc --      Pain Edu? --      Excl. in GC? --      Constitutional: Alert and oriented. Well appearing and in no acute distress. Eyes: Conjunctivae are normal. PERRL. EOMI. Head: Atraumatic. ENT:      Ears:       Nose: No congestion/rhinnorhea.      Mouth/Throat: Mucous membranes are moist.  Neck: No stridor.   Hematological/Lymphatic/Immunilogical: No cervical lymphadenopathy. Cardiovascular: Normal rate, regular rhythm. Normal S1 and S2.  Good peripheral circulation. Respiratory: Normal respiratory effort without tachypnea or retractions. Lungs CTAB. Gastrointestinal: Soft and nontender. No distention. No CVA tenderness. Musculoskeletal: No lower extremity tenderness nor edema.  No joint effusions. Neurologic:  Normal speech and language. No gross focal neurologic deficits are appreciated.  Skin:  Skin is warm, dry and intact. No rash noted. Laceration to anterior L forearm. Laceration has clean, smooth edges. No visible foreign body is appreciated. Bleeding is controlled. Subcutaneous tissue is exposed. FROM to L arm. Radial pulse is appreciated. Sensation is appreciated x 5 digits and equal to unaffected side. Psychiatric:  Mood and affect are normal. Speech and behavior are normal. Patient exhibits appropriate insight and judgement.   ____________________________________________   LABS (all labs ordered are listed, but only abnormal results are displayed)  Labs Reviewed - No data to display ____________________________________________  EKG   ____________________________________________  RADIOLOGY   No results  found.  ____________________________________________    PROCEDURES  Procedure(s) performed:    LACERATION REPAIR Performed by: Racheal Patches Authorized by: Delorise Royals John Williamsen Consent: Verbal consent obtained. Risks and benefits: risks, benefits and alternatives were discussed Consent given by: patient Patient identity confirmed: provided demographic data Prepped and Draped in normal sterile fashion Wound explored  Laceration Location: L anterior forearm  Laceration Length: 7 cm  No Foreign Bodies seen or palpated  Anesthesia: local infiltration  Local anesthetic: lidocaine 1% with epinephrine  Anesthetic total: 8 ml  Irrigation method: syringe Amount of cleaning: standard  Skin closure: 4-0 Ethilon sutures  Number of sutures: 7   Technique: Simple interrupted  Patient tolerance: Patient tolerated the procedure well with no immediate complications.    Medications  lidocaine-EPINEPHrine (XYLOCAINE W/EPI) 2 %-1:200000 (PF) injection 10 mL (not administered)  lidocaine (PF) (XYLOCAINE) 1 % injection (not administered)  Tdap (BOOSTRIX) injection 0.5 mL (0.5 mLs Intramuscular Given 05/19/15 1842)  ondansetron (ZOFRAN-ODT) disintegrating tablet 8 mg (8 mg Oral Given 05/19/15 1840)     ____________________________________________   INITIAL IMPRESSION / ASSESSMENT AND PLAN / ED COURSE  Pertinent labs & imaging results that were available during my care of the patient were reviewed by me and considered in my medical decision making (see chart for details).  Patient's diagnosis is consistent with forearm laceration. . Patient is to follow up with primary care provider if symptoms persist past this treatment course. Patient is to follow-up with primary care or urgent care in 7 days for suture removal Patient is given ED precautions to return to the ED for any worsening or new symptoms.     ____________________________________________  FINAL CLINICAL  IMPRESSION(S) / ED DIAGNOSES  Final diagnoses:  Forearm laceration, left, initial encounter      NEW MEDICATIONS STARTED DURING THIS VISIT:  New Prescriptions   No medications on file        Racheal Patches, PA-C 05/19/15 1902  Phineas Semen, MD 05/19/15 2000

## 2015-05-19 NOTE — Discharge Instructions (Signed)
Laceration Care, Adult °A laceration is a cut that goes through all of the layers of the skin and into the tissue that is right under the skin. Some lacerations heal on their own. Others need to be closed with stitches (sutures), staples, skin adhesive strips, or skin glue. Proper laceration care minimizes the risk of infection and helps the laceration to heal better. °HOW TO CARE FOR YOUR LACERATION °If sutures or staples were used: °· Keep the wound clean and dry. °· If you were given a bandage (dressing), you should change it at least one time per day or as told by your health care provider. You should also change it if it becomes wet or dirty. °· Keep the wound completely dry for the first 24 hours or as told by your health care provider. After that time, you may shower or bathe. However, make sure that the wound is not soaked in water until after the sutures or staples have been removed. °· Clean the wound one time each day or as told by your health care provider: °· Wash the wound with soap and water. °· Rinse the wound with water to remove all soap. °· Pat the wound dry with a clean towel. Do not rub the wound. °· After cleaning the wound, apply a thin layer of antibiotic ointment as told by your health care provider. This will help to prevent infection and keep the dressing from sticking to the wound. °· Have the sutures or staples removed as told by your health care provider. °If skin adhesive strips were used: °· Keep the wound clean and dry. °· If you were given a bandage (dressing), you should change it at least one time per day or as told by your health care provider. You should also change it if it becomes dirty or wet. °· Do not get the skin adhesive strips wet. You may shower or bathe, but be careful to keep the wound dry. °· If the wound gets wet, pat it dry with a clean towel. Do not rub the wound. °· Skin adhesive strips fall off on their own. You may trim the strips as the wound heals. Do not  remove skin adhesive strips that are still stuck to the wound. They will fall off in time. °If skin glue was used: °· Try to keep the wound dry, but you may briefly wet it in the shower or bath. Do not soak the wound in water, such as by swimming. °· After you have showered or bathed, gently pat the wound dry with a clean towel. Do not rub the wound. °· Do not do any activities that will make you sweat heavily until the skin glue has fallen off on its own. °· Do not apply liquid, cream, or ointment medicine to the wound while the skin glue is in place. Using those may loosen the film before the wound has healed. °· If you were given a bandage (dressing), you should change it at least one time per day or as told by your health care provider. You should also change it if it becomes dirty or wet. °· If a dressing is placed over the wound, be careful not to apply tape directly over the skin glue. Doing that may cause the glue to be pulled off before the wound has healed. °· Do not pick at the glue. The skin glue usually remains in place for 5-10 days, then it falls off of the skin. °General Instructions °· Take over-the-counter and prescription   medicines only as told by your health care provider. °· If you were prescribed an antibiotic medicine or ointment, take or apply it as told by your doctor. Do not stop using it even if your condition improves. °· To help prevent scarring, make sure to cover your wound with sunscreen whenever you are outside after stitches are removed, after adhesive strips are removed, or when glue remains in place and the wound is healed. Make sure to wear a sunscreen of at least 30 SPF. °· Do not scratch or pick at the wound. °· Keep all follow-up visits as told by your health care provider. This is important. °· Check your wound every day for signs of infection. Watch for: °· Redness, swelling, or pain. °· Fluid, blood, or pus. °· Raise (elevate) the injured area above the level of your heart  while you are sitting or lying down, if possible. °SEEK MEDICAL CARE IF: °· You received a tetanus shot and you have swelling, severe pain, redness, or bleeding at the injection site. °· You have a fever. °· A wound that was closed breaks open. °· You notice a bad smell coming from your wound or your dressing. °· You notice something coming out of the wound, such as wood or glass. °· Your pain is not controlled with medicine. °· You have increased redness, swelling, or pain at the site of your wound. °· You have fluid, blood, or pus coming from your wound. °· You notice a change in the color of your skin near your wound. °· You need to change the dressing frequently due to fluid, blood, or pus draining from the wound. °· You develop a new rash. °· You develop numbness around the wound. °SEEK IMMEDIATE MEDICAL CARE IF: °· You develop severe swelling around the wound. °· Your pain suddenly increases and is severe. °· You develop painful lumps near the wound or on skin that is anywhere on your body. °· You have a red streak going away from your wound. °· The wound is on your hand or foot and you cannot properly move a finger or toe. °· The wound is on your hand or foot and you notice that your fingers or toes look pale or bluish. °  °This information is not intended to replace advice given to you by your health care provider. Make sure you discuss any questions you have with your health care provider. °  °Document Released: 04/18/2005 Document Revised: 09/02/2014 Document Reviewed: 04/14/2014 °Elsevier Interactive Patient Education ©2016 Elsevier Inc. ° °Stitches, Staples, or Adhesive Wound Closure °Health care providers use stitches (sutures), staples, and certain glue (skin adhesives) to hold skin together while it heals (wound closure). You may need this treatment after you have surgery or if you cut your skin accidentally. These methods help your skin to heal more quickly and make it less likely that you will have  a scar. A wound may take several months to heal completely. °The type of wound you have determines when your wound gets closed. In most cases, the wound is closed as soon as possible (primary skin closure). Sometimes, closure is delayed so the wound can be cleaned and allowed to heal naturally. This reduces the chance of infection. Delayed closure may be needed if your wound: °· Is caused by a bite. °· Happened more than 6 hours ago. °· Involves loss of skin or the tissues under the skin. °· Has dirt or debris in it that cannot be removed. °· Is infected. °WHAT   ARE THE DIFFERENT KINDS OF WOUND CLOSURES? °There are many options for wound closure. The one that your health care provider uses depends on how deep and how large your wound is. °Adhesive Glue °To use this type of glue to close a wound, your health care provider holds the edges of the wound together and paints the glue on the surface of your skin. You may need more than one layer of glue. Then the wound may be covered with a light bandage (dressing). °This type of skin closure may be used for small wounds that are not deep (superficial). Using glue for wound closure is less painful than other methods. It does not require a medicine that numbs the area (local anesthetic). This method also leaves nothing to be removed. Adhesive glue is often used for children and on facial wounds. °Adhesive glue cannot be used for wounds that are deep, uneven, or bleeding. It is not used inside of a wound.  °Adhesive Strips °These strips are made of sticky (adhesive), porous paper. They are applied across your skin edges like a regular adhesive bandage. You leave them on until they fall off. °Adhesive strips may be used to close very superficial wounds. They may also be used along with sutures to improve the closure of your skin edges.  °Sutures °Sutures are the oldest method of wound closure. Sutures can be made from natural substances, such as silk, or from synthetic  materials, such as nylon and steel. They can be made from a material that your body can break down as your wound heals (absorbable), or they can be made from a material that needs to be removed from your skin (nonabsorbable). They come in many different strengths and sizes. °Your health care provider attaches the sutures to a steel needle on one end. Sutures can be passed through your skin, or through the tissues beneath your skin. Then they are tied and cut. Your skin edges may be closed in one continuous stitch or in separate stitches. °Sutures are strong and can be used for all kinds of wounds. Absorbable sutures may be used to close tissues under the skin. The disadvantage of sutures is that they may cause skin reactions that lead to infection. Nonabsorbable sutures need to be removed. °Staples °When surgical staples are used to close a wound, the edges of your skin on both sides of the wound are brought close together. A staple is placed across the wound, and an instrument secures the edges together. Staples are often used to close surgical cuts (incisions). °Staples are faster to use than sutures, and they cause less skin reaction. Staples need to be removed using a tool that bends the staples away from your skin. °HOW DO I CARE FOR MY WOUND CLOSURE? °· Take medicines only as directed by your health care provider. °· If you were prescribed an antibiotic medicine for your wound, finish it all even if you start to feel better. °· Use ointments or creams only as directed by your health care provider. °· Wash your hands with soap and water before and after touching your wound. °· Do not soak your wound in water. Do not take baths, swim, or use a hot tub until your health care provider approves. °· Ask your health care provider when you can start showering. Cover your wound if directed by your health care provider. °· Do not take out your own sutures or staples. °· Do not pick at your wound. Picking can cause an  infection. °·   Keep all follow-up visits as directed by your health care provider. This is important. °HOW LONG WILL I HAVE MY WOUND CLOSURE? °· Leave adhesive glue on your skin until the glue peels away. °· Leave adhesive strips on your skin until the strips fall off. °· Absorbable sutures will dissolve within several days. °· Nonabsorbable sutures and staples must be removed. The location of the wound will determine how long they stay in. This can range from several days to a couple of weeks. °WHEN SHOULD I SEEK HELP FOR MY WOUND CLOSURE? °Contact your health care provider if: °· You have a fever. °· You have chills. °· You have drainage, redness, swelling, or pain at your wound. °· There is a bad smell coming from your wound. °· The skin edges of your wound start to separate after your sutures have been removed. °· Your wound becomes thick, raised, and darker in color after your sutures come out (scarring). °  °This information is not intended to replace advice given to you by your health care provider. Make sure you discuss any questions you have with your health care provider. °  °Document Released: 01/11/2001 Document Revised: 05/09/2014 Document Reviewed: 09/25/2013 °Elsevier Interactive Patient Education ©2016 Elsevier Inc. ° °
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
## 2015-07-11 ENCOUNTER — Inpatient Hospital Stay
Admit: 2015-07-11 | Discharge: 2015-07-11 | Discharge status: Against Medical Advice | Payer: MEDICAID | Attending: Emergency Medicine

## 2015-07-11 DIAGNOSIS — O4693 Antepartum hemorrhage, unspecified, third trimester: Secondary | ICD-10-CM

## 2015-07-11 NOTE — ED Notes (Signed)
Pt signed against medical advise, MD Maurine SimmeringWentzel spoke to the patient, pt verbalized understanding of risk of leaving facility against medical advise, pt ambulatory, awake alert, not in any acute distress.

## 2015-07-11 NOTE — ED Notes (Signed)
Pt verbally refused IV placement, and don't want to go to [facility], MD Maurine SimmeringWentzel made aware. Pt awake alert, not in any acute distress. Vital signs stable

## 2015-07-11 NOTE — ED Provider Notes (Addendum)
HPI Comments: 3:39 PM Lacey [name] is a 25 y.o. female who is 37 weeks and 2 days pregnant presents to ED c/o slight vaginal bleeding onset this morning. Pt noticed a "streak" of blood when she was going to the bathroom but denies clots or heavy bleeding. Pt also reports severe lower back pain onset 4-5 days ago and slight abdominal cramping but denies current abdominal cramping.  Pt also notes that she has noticed a decrease in fetal activity today. Pt has felt the baby move approximately 3 times today but she usually baby feels movement constantly throughout the day. Pt has not had any prenatal care, including US, and plans to deliver child at home. This is Pts 3rd pregnancy and has two living children.  Pt reports having eclampsia and seizure with her first pregnancy.  Pts first child was born at 7314oz. [place] No other concerns at this time.         Patient is a 25 y.o. female presenting with abdominal pain and vaginal bleeding.   Abdominal Pain    Associated symptoms include back pain. Pertinent negatives include no fever, no nausea, no vomiting, no dysuria and no chest pain.   Vaginal Bleeding   Associated symptoms include abdominal pain (cramping). Pertinent negatives include no chest pain and no shortness of breath.        No past medical history on file.    No past surgical history on file.      No family history on file.    Social History     Social History   ??? Marital status: SINGLE     Spouse name: N/A   ??? Number of children: N/A   ??? Years of education: N/A     Occupational History   ??? Not on file.     Social History Main Topics   ??? Smoking status: Not on file   ??? Smokeless tobacco: Not on file   ??? Alcohol use Not on file   ??? Drug use: Not on file   ??? Sexual activity: Not on file     Other Topics Concern   ??? Not on file     Social History Narrative         ALLERGIES: Review of patient's allergies indicates no known allergies.    Review of Systems    Constitutional: Negative for chills, fatigue, fever and unexpected weight change.   HENT: Negative for congestion and rhinorrhea.    Respiratory: Negative for chest tightness and shortness of breath.    Cardiovascular: Negative for chest pain, palpitations and leg swelling.   Gastrointestinal: Positive for abdominal pain (cramping). Negative for nausea and vomiting.   Genitourinary: Positive for vaginal bleeding. Negative for dysuria.   Musculoskeletal: Positive for back pain.   Skin: Negative for rash.   Neurological: Negative for dizziness and weakness.   Psychiatric/Behavioral: The patient is not nervous/anxious.    All other systems reviewed and are negative.      Vitals:    07/11/15 1531   BP: (!) 138/93   Pulse: (!) 122   Resp: 16   Temp: 98.4 ??F (36.9 ??C)   SpO2: 100%   Weight: 89.8 kg (198 lb)   Height: 5\' 4"  (1.626 m)            Physical Exam   Constitutional: She is oriented to person, place, and time. She appears well-developed.   HENT:   Head: Normocephalic and atraumatic.   Mouth/Throat: Oropharynx  is clear and moist.   Eyes: Conjunctivae and EOM are normal. Pupils are equal, round, and reactive to light.   Neck: Normal range of motion. Neck supple.   Cardiovascular: Normal rate and regular rhythm.    Pulmonary/Chest: Effort normal and breath sounds normal.   Abdominal: Soft.   Musculoskeletal: Normal range of motion.   Neurological: She is alert and oriented to person, place, and time.   Skin: Skin is warm and dry.   Psychiatric: She has a normal mood and affect.   Nursing note and vitals reviewed.       MDM  Number of Diagnoses or Management Options  Vaginal bleeding in pregnancy, third trimester:   Diagnosis management comments: I've spoken with L&D at [facility] and arranged for pt to be transported to [facility] for further care and monitoring.  Reuel Derby, MD     Pt has now decided that she does not want to have any further care or transport to L&D. She says she wants to "do everything naturally". I've  strongly  Encouraged her to reconsider or to see medical care when she returns home. Further, I explained to her that she can return to the ED at any time to resume care should she change her mind.  Reuel Derby, MD  4:00 PM      ED Course       Procedures      Vitals:  Patient Vitals for the past 12 hrs:   Temp Pulse Resp BP SpO2   07/11/15 1531 98.4 ??F (36.9 ??C) (!) 122 16 (!) 138/93 100 %     100% on RA, indicating adequate oxygenation.     Medications ordered:   Medications - No data to display    Lab findings:  No results found for this or any previous visit (from the past 12 hour(s)).      X-Ray, CT or other radiology findings or impressions:  No orders to display       Progress notes, Consult notes or additional Procedure notes:     Disposition:  Diagnosis: No diagnosis found.    Disposition:     Follow-up Information     None            Patient's Medications    No medications on file     Scribe Attestation:     I, Ermalene Postin, scribing for and in the presence of  Dr.Carleigh Buccieri Hessie Knows, MD July 11, 2015 at 3:41 PM     Physician Attestation:   I personally performed the services described in this documentation, reviewed and edited the documentation which was dictated to the scribe in my presence, and it accurately records my words and actions. Reuel Derby, MD  July 11, 2015     Signed by: Ermalene Postin, Scribe, 07/11/15, 3:41 PM

## 2015-09-21 ENCOUNTER — Emergency Department
Admission: EM | Admit: 2015-09-21 | Discharge: 2015-09-21 | Disposition: A | Discharge status: Home / Self Care | Payer: Medicaid Other | Attending: Emergency Medicine | Admitting: Emergency Medicine

## 2015-09-21 ENCOUNTER — Emergency Department: Payer: Medicaid Other

## 2015-09-21 ENCOUNTER — Encounter: Payer: Self-pay | Admitting: Emergency Medicine

## 2015-09-21 DIAGNOSIS — Y939 Activity, unspecified: Secondary | ICD-10-CM | POA: Insufficient documentation

## 2015-09-21 DIAGNOSIS — Y999 Unspecified external cause status: Secondary | ICD-10-CM | POA: Diagnosis not present

## 2015-09-21 DIAGNOSIS — F17210 Nicotine dependence, cigarettes, uncomplicated: Secondary | ICD-10-CM | POA: Insufficient documentation

## 2015-09-21 DIAGNOSIS — Z043 Encounter for examination and observation following other accident: Secondary | ICD-10-CM | POA: Insufficient documentation

## 2015-09-21 DIAGNOSIS — Y929 Unspecified place or not applicable: Secondary | ICD-10-CM | POA: Insufficient documentation

## 2015-09-21 DIAGNOSIS — Z87828 Personal history of other (healed) physical injury and trauma: Secondary | ICD-10-CM

## 2015-09-21 LAB — BASIC METABOLIC PANEL
Anion gap: 6 (ref 5–15)
BUN: 12 mg/dL (ref 6–20)
CALCIUM: 8.7 mg/dL — AB (ref 8.9–10.3)
CHLORIDE: 106 mmol/L (ref 101–111)
CO2: 27 mmol/L (ref 22–32)
CREATININE: 0.83 mg/dL (ref 0.44–1.00)
GFR calc non Af Amer: >60 mL/min (ref >60)
Glucose, Bld: 88 mg/dL (ref 65–99)
Potassium: 4.2 mmol/L (ref 3.5–5.1)
Sodium: 139 mmol/L (ref 135–145)

## 2015-09-21 LAB — POCT PREGNANCY, URINE: PREG TEST UR: NEGATIVE

## 2015-09-21 MED ORDER — PREDNISONE 10 MG PO TABS: 50.0000 mg | ORAL_TABLET | Freq: Every day | ORAL | Status: AC

## 2015-09-21 MED ORDER — KETOROLAC TROMETHAMINE 60 MG/2ML IM SOLN
60.0000 mg | Freq: Once | INTRAMUSCULAR | Status: AC
  Administered 2015-09-21: 60 mg via INTRAMUSCULAR
  Filled 2015-09-21: qty 2

## 2015-09-21 MED ORDER — LIDOCAINE VISCOUS 2 % MT SOLN: 20.0000 mL | OROMUCOSAL | Status: AC | PRN

## 2015-09-21 MED ORDER — LIDOCAINE VISCOUS 2 % MT SOLN
15.0000 mL | Freq: Once | OROMUCOSAL | Status: AC
  Administered 2015-09-21: 15 mL via OROMUCOSAL
  Filled 2015-09-21: qty 15
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
## 2015-09-21 NOTE — ED Notes (Signed)
Pt refusing d/c papers; PA Jenise to bedside to address pt concerns. Pt refusing to sign d/c. Pt ambulatory to discharge.
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
## 2015-10-20 ENCOUNTER — Emergency Department: Payer: Medicaid Other

## 2015-10-20 ENCOUNTER — Other Ambulatory Visit: Payer: Self-pay

## 2015-10-20 ENCOUNTER — Emergency Department
Admission: EM | Admit: 2015-10-20 | Discharge: 2015-10-20 | Disposition: A | Discharge status: Home / Self Care | Payer: Medicaid Other | Attending: Emergency Medicine | Admitting: Emergency Medicine

## 2015-10-20 ENCOUNTER — Encounter: Payer: Self-pay | Admitting: Emergency Medicine

## 2015-10-20 DIAGNOSIS — F17210 Nicotine dependence, cigarettes, uncomplicated: Secondary | ICD-10-CM | POA: Diagnosis not present

## 2015-10-20 DIAGNOSIS — R05 Cough: Secondary | ICD-10-CM

## 2015-10-20 DIAGNOSIS — R062 Wheezing: Secondary | ICD-10-CM | POA: Insufficient documentation

## 2015-10-20 DIAGNOSIS — R0602 Shortness of breath: Secondary | ICD-10-CM | POA: Diagnosis not present

## 2015-10-20 DIAGNOSIS — R059 Cough, unspecified: Secondary | ICD-10-CM

## 2015-10-20 MED ORDER — IPRATROPIUM-ALBUTEROL 0.5-2.5 (3) MG/3ML IN SOLN
3.0000 mL | Freq: Once | RESPIRATORY_TRACT | Status: AC
  Administered 2015-10-20: 3 mL via RESPIRATORY_TRACT
  Filled 2015-10-20: qty 3

## 2015-10-20 MED ORDER — ALBUTEROL SULFATE HFA 108 (90 BASE) MCG/ACT IN AERS: 2.0000 | INHALATION_SPRAY | Freq: Four times a day (QID) | RESPIRATORY_TRACT | Status: AC | PRN
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
## 2015-10-20 NOTE — Discharge Instructions (Signed)
Please seek medical attention for any high fevers, chest pain, shortness of breath, change in behavior, persistent vomiting, bloody stool or any other new or concerning symptoms. ° ° °Cough, Adult °A cough helps to clear your throat and lungs. A cough may last only 2-3 weeks (acute), or it may last longer than 8 weeks (chronic). Many different things can cause a cough. A cough may be a sign of an illness or another medical condition. °HOME CARE °· Pay attention to any changes in your cough. °· Take medicines only as told by your doctor. °¨ If you were prescribed an antibiotic medicine, take it as told by your doctor. Do not stop taking it even if you start to feel better. °¨ Talk with your doctor before you try using a cough medicine. °· Drink enough fluid to keep your pee (urine) clear or pale yellow. °· If the air is dry, use a cold steam vaporizer or humidifier in your home. °· Stay away from things that make you cough at work or at home. °· If your cough is worse at night, try using extra pillows to raise your head up higher while you sleep. °· Do not smoke, and try not to be around smoke. If you need help quitting, ask your doctor. °· Do not have caffeine. °· Do not drink alcohol. °· Rest as needed. °GET HELP IF: °· You have new problems (symptoms). °· You cough up yellow fluid (pus). °· Your cough does not get better after 2-3 weeks, or your cough gets worse. °· Medicine does not help your cough and you are not sleeping well. °· You have pain that gets worse or pain that is not helped with medicine. °· You have a fever. °· You are losing weight and you do not know why. °· You have night sweats. °GET HELP RIGHT AWAY IF: °· You cough up blood. °· You have trouble breathing. °· Your heartbeat is very fast. °  °This information is not intended to replace advice given to you by your health care provider. Make sure you discuss any questions you have with your health care provider. °  °Document Released: 12/30/2010  Document Revised: 01/07/2015 Document Reviewed: 06/25/2014 °Elsevier Interactive Patient Education ©2016 [employer] ° °
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
## 2016-02-06 IMAGING — US US TRANSVAGINAL NON-OB
1 series · 14 of 25 positions shown · non-contrast
Comparison: None

CLINICAL DATA: Pain and cramping

EXAM:
TRANSABDOMINAL AND TRANSVAGINAL ULTRASOUND OF PELVIS
TECHNIQUE: Both transabdominal and transvaginal ultrasound examinations of the
pelvis were performed. Transabdominal technique was performed for
global imaging of the pelvis including uterus, ovaries, adnexal
regions, and pelvic cul-de-sac. It was necessary to proceed with
endovaginal exam following the transabdominal exam to visualize the
endometrium and ovaries.

[Series 1: us transvaginal non-ob · 0.18mm/px · 14 of 88 slices shown]
[im 1/88]
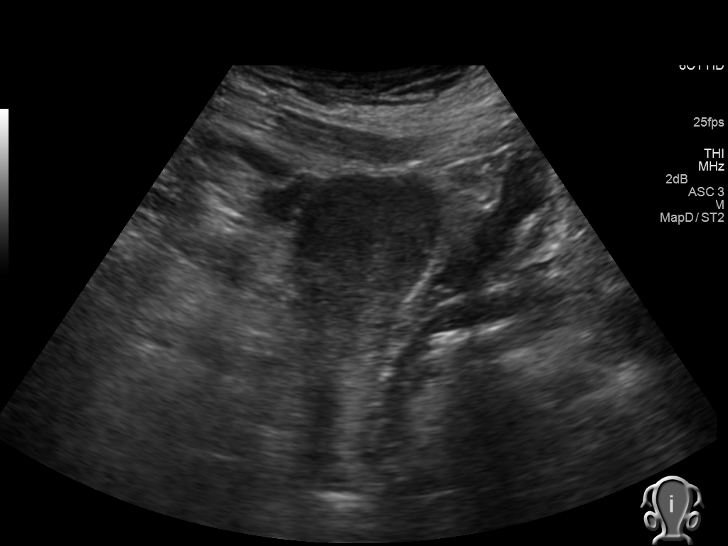
[im 8/88]
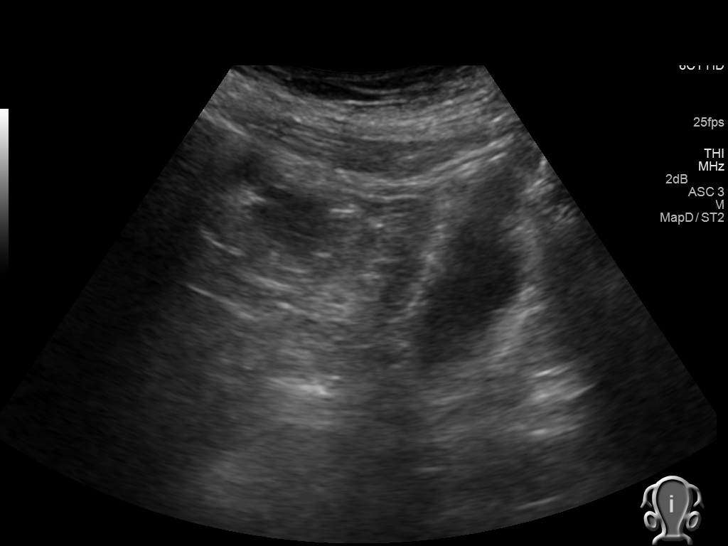
[im 15/88]
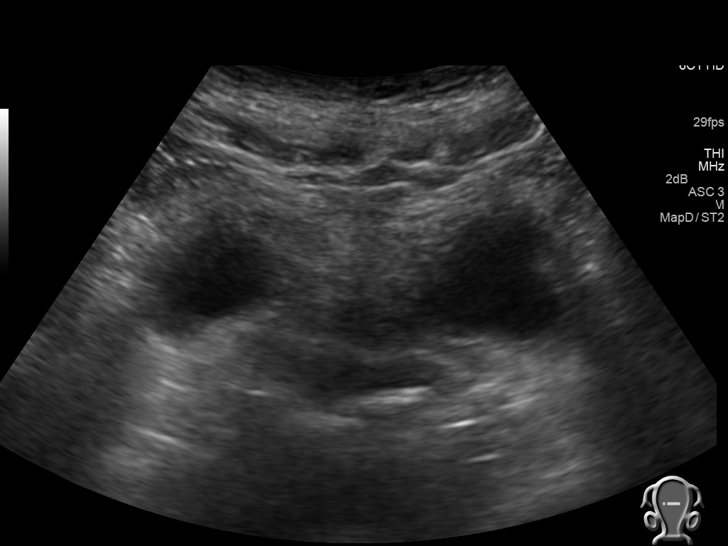
[im 22/88]
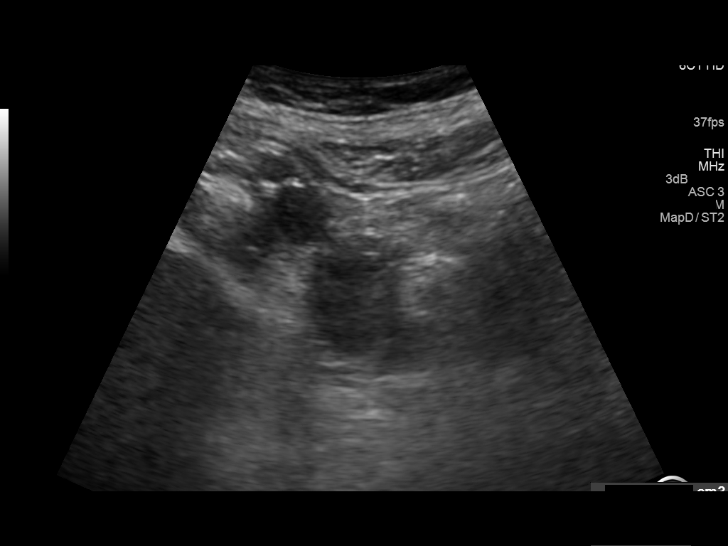
[im 30/88]
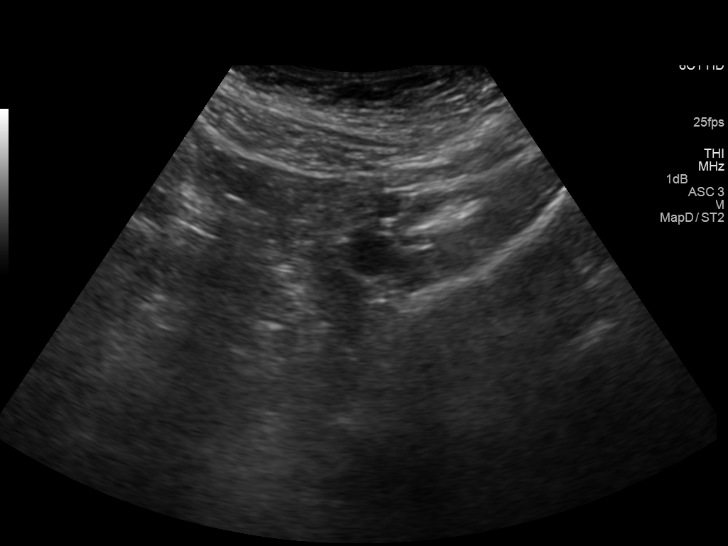
[im 33/88]
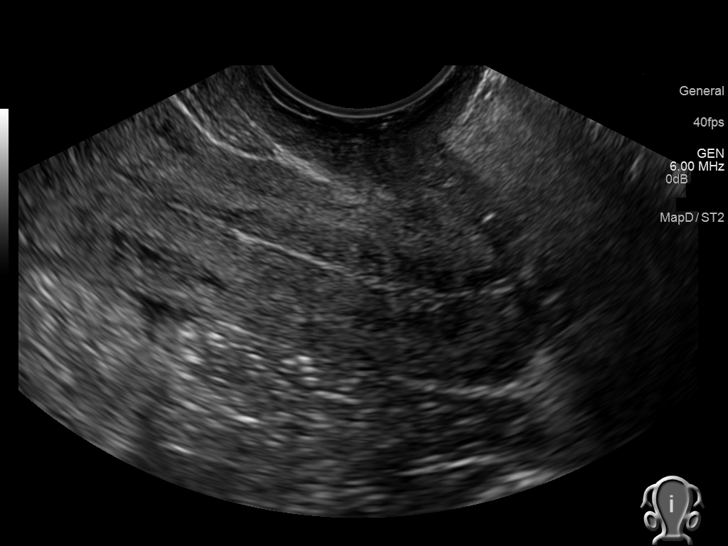
[im 40/88]
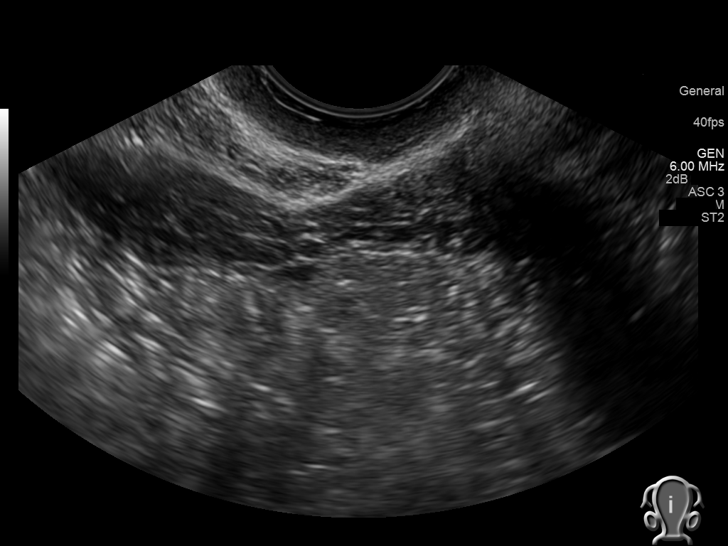
[im 48/88]
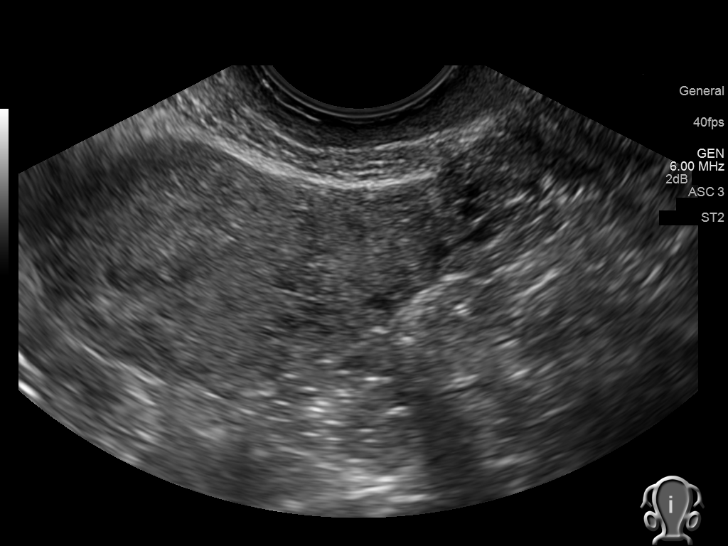
[im 55/88]
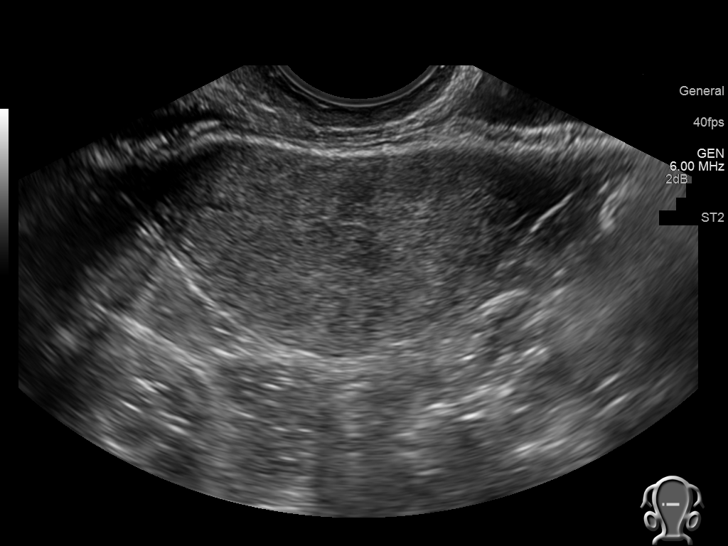
[im 59/88]
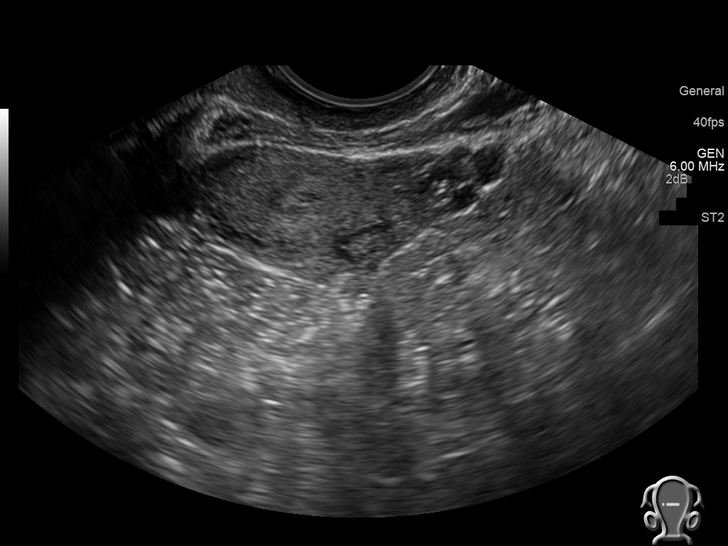
[im 66/88]
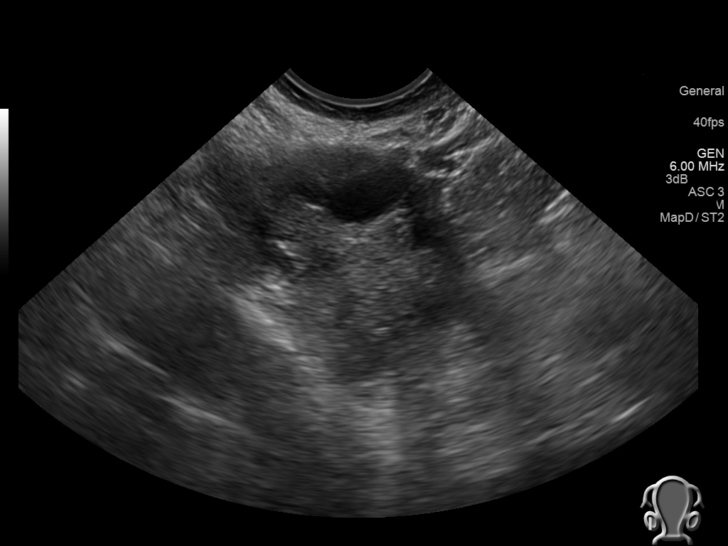
[im 73/88]
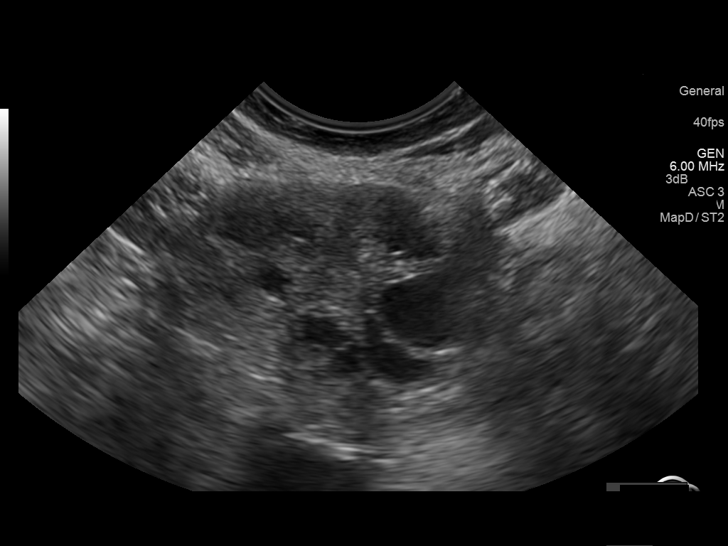
[im 80/88]
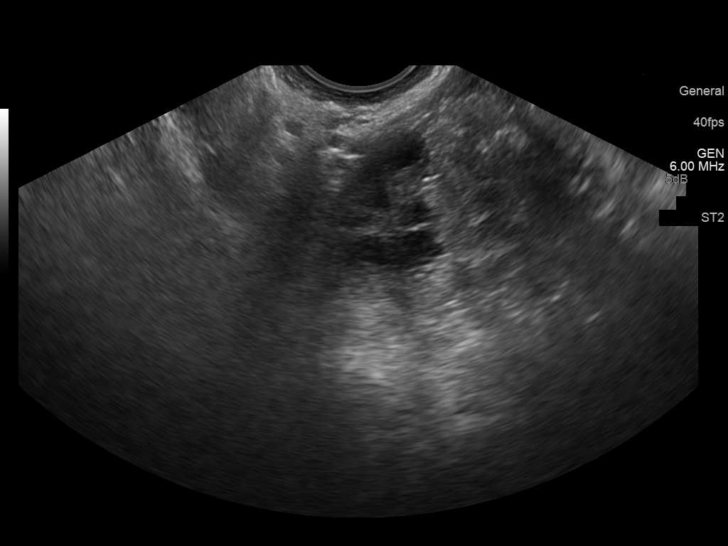
[im 88/88]
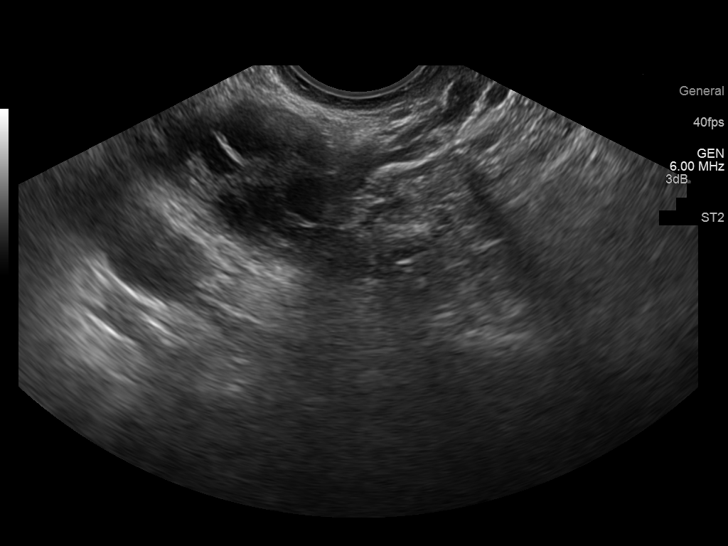

[14 of 25 positions shown; findings below may reference images not displayed]

FINDINGS: Uterus

Measurements: 8.3 x 3.7 x 4.7 cm. No fibroids or other mass
visualized.

Endometrium

Thickness: 6.3 mm.  No focal abnormality visualized.

Right ovary

Measurements: 3.7 x 2.1 x 2.1 cm. Normal appearance/no adnexal mass.

Left ovary

Measurements: 2.6 x 1.6 x 2 cm. Normal appearance/no adnexal mass.

Other findings

No free fluid.
IMPRESSION: Normal pelvic ultrasound.

## 2016-07-27 ENCOUNTER — Ambulatory Visit: Admit: 2016-07-27 | Discharge: 2016-07-27 | Payer: PRIVATE HEALTH INSURANCE | Attending: Family | Primary: Family

## 2016-07-27 DIAGNOSIS — F419 Anxiety disorder, unspecified: Secondary | ICD-10-CM

## 2016-07-27 LAB — AMB POC URINALYSIS DIP STICK AUTO W/O MICRO
Bilirubin (UA POC): NEGATIVE
Blood (UA POC): NEGATIVE
Glucose (UA POC): NEGATIVE
Ketones (UA POC): NEGATIVE
Leukocyte esterase (UA POC): NEGATIVE
Nitrites (UA POC): NEGATIVE
Specific gravity (UA POC): 1.015 (ref 1.001–1.035)
Urobilinogen (UA POC): 0.2 (ref 0.2–1)
pH (UA POC): 7.5 (ref 4.6–8.0)

## 2016-07-27 LAB — AMB POC URINE PREGNANCY TEST, VISUAL COLOR COMPARISON: HCG urine, Ql. (POC): NEGATIVE

## 2016-07-27 MED ORDER — VARENICLINE 0.5 MG (11)-1 MG (3X14) TABS IN A DOSE PACK
0.5 mg (11)- 1 mg (42) | ORAL | 0 refills | Status: DC
Start: 2016-07-27 — End: 2016-12-08

## 2016-07-27 MED ORDER — AMITRIPTYLINE 100 MG TAB
100 mg | ORAL_TABLET | Freq: Every evening | ORAL | 2 refills | Status: DC
Start: 2016-07-27 — End: 2016-08-16
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
## 2016-07-27 NOTE — Progress Notes (Signed)
Chief Complaint   Patient presents with   ??? Establish Care     Patient Is here today as a New Patient.  Pt has concerns with weight gain.  [facility]  Pt has concern with tingling in left wrist and swelling in legs from time to time.    Pt sts she is having problems sleeping and has dizziness.  Would like to consider Trazdone.

## 2016-07-27 NOTE — Patient Instructions (Signed)
A Healthy Lifestyle: Care Instructions  Your Care Instructions    A healthy lifestyle can help you feel good, stay at a healthy weight, and have plenty of energy for both work and play. A healthy lifestyle is something you can share with your whole family.  A healthy lifestyle also can lower your risk for serious health problems, such as high blood pressure, heart disease, and diabetes.  You can follow a few steps listed below to improve your health and the health of your family.  Follow-up care is a key part of your treatment and safety. Be sure to make and go to all appointments, and call your doctor if you are having problems. It's also a good idea to know your test results and keep a list of the medicines you take.  How can you care for yourself at home?  ?? Do not eat too much sugar, fat, or fast foods. You can still have dessert and treats now and then. The goal is moderation.  ?? Start small to improve your eating habits. Pay attention to portion sizes, drink less juice and soda pop, and eat more fruits and vegetables.  ?? Eat a healthy amount of food. A 3-ounce serving of meat, for example, is about the size of a deck of cards. Fill the rest of your plate with vegetables and whole grains.  ?? Limit the amount of soda and sports drinks you have every day. Drink more water when you are thirsty.  ?? Eat at least 5 servings of fruits and vegetables every day. It may seem like a lot, but it is not hard to reach this goal. A serving or helping is 1 piece of fruit, 1 cup of vegetables, or 2 cups of leafy, raw vegetables. Have an apple or some carrot sticks as an afternoon snack instead of a candy bar. Try to have fruits and/or vegetables at every meal.  ?? Make exercise part of your daily routine. You may want to start with simple activities, such as walking, bicycling, or slow swimming. Try to be active 30 to 60 minutes every day. You do not need to do all 30 to 60  minutes all at once. For example, you can exercise 3 times a day for 10 or 20 minutes. Moderate exercise is safe for most people, but it is always a good idea to talk to your doctor before starting an exercise program.  ?? Keep moving. Mow the lawn, work in the garden, or clean your house. Take the stairs instead of the elevator at work.  ?? If you smoke, quit. People who smoke have an increased risk for heart attack, stroke, cancer, and other lung illnesses. Quitting is hard, but there are ways to boost your chance of quitting tobacco for good.  ?? Use nicotine gum, patches, or lozenges.  ?? Ask your doctor about stop-smoking programs and medicines.  ?? Keep trying.  In addition to reducing your risk of diseases in the future, you will notice some benefits soon after you stop using tobacco. If you have shortness of breath or asthma symptoms, they will likely get better within a few weeks after you quit.  ?? Limit how much alcohol you drink. Moderate amounts of alcohol (up to 2 drinks a day for men, 1 drink a day for women) are okay. But drinking too much can lead to liver problems, high blood pressure, and other health problems.  Family health  If you have a family, there are many things you   can do together to improve your health.  ?? Eat meals together as a family as often as possible.  ?? Eat healthy foods. This includes fruits, vegetables, lean meats and dairy, and whole grains.  ?? Include your family in your fitness plan. Most people think of activities such as jogging or tennis as the way to fitness, but there are many ways you and your family can be more active. Anything that makes you breathe hard and gets your heart pumping is exercise. Here are some tips:  ?? Walk to do errands or to take your child to school or the bus.  ?? Go for a family bike ride after dinner instead of watching TV.  Where can you learn more?  Go to http://www.healthwise.net/[employer].   Enter U807 in the search box to learn more about "A Healthy Lifestyle: Care Instructions."  Current as of: Sep 11, 2015  Content Version: 11.4  ?? 2006-2017 [employer]. Care instructions adapted under license by [employer] (which disclaims liability or warranty for this information). If you have questions about a medical condition or this instruction, always ask your healthcare professional. Healthwise, Incorporated disclaims any warranty or liability for your use of this information.

## 2016-08-09 ENCOUNTER — Encounter: Attending: Family | Primary: Family

## 2016-08-12 ENCOUNTER — Encounter: Attending: Family | Primary: Family

## 2016-08-16 ENCOUNTER — Ambulatory Visit: Admit: 2016-08-16 | Discharge: 2016-08-16 | Payer: PRIVATE HEALTH INSURANCE | Attending: Family | Primary: Family

## 2016-08-16 DIAGNOSIS — F419 Anxiety disorder, unspecified: Secondary | ICD-10-CM

## 2016-08-16 LAB — AMB POC URINE PREGNANCY TEST, VISUAL COLOR COMPARISON: HCG urine, Ql. (POC): NEGATIVE

## 2016-08-16 MED ORDER — AMITRIPTYLINE 100 MG TAB
100 mg | ORAL_TABLET | Freq: Every evening | ORAL | 2 refills | Status: DC
Start: 2016-08-16 — End: 2016-12-08

## 2016-08-16 MED ORDER — TRAZODONE 50 MG TAB
50 mg | ORAL_TABLET | Freq: Every evening | ORAL | 2 refills | Status: DC
Start: 2016-08-16 — End: 2016-12-08

## 2016-08-16 MED ORDER — VARENICLINE 1 MG TAB
1 mg | ORAL_TABLET | Freq: Two times a day (BID) | ORAL | 2 refills | Status: DC
Start: 2016-08-16 — End: 2016-12-08

## 2016-08-16 MED ORDER — ONDANSETRON 8 MG TAB, RAPID DISSOLVE
8 mg | ORAL_TABLET | Freq: Two times a day (BID) | ORAL | 0 refills | Status: DC | PRN
Start: 2016-08-16 — End: 2016-09-19
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
## 2016-08-16 NOTE — Patient Instructions (Addendum)
Anxiety Disorder: Care Instructions  Your Care Instructions    Anxiety is a normal reaction to stress. Difficult situations can cause you to have symptoms such as sweaty palms and a nervous feeling.  In an anxiety disorder, the symptoms are far more severe. Constant worry, muscle tension, trouble sleeping, nausea and diarrhea, and other symptoms can make normal daily activities difficult or impossible. These symptoms may occur for no reason, and they can affect your work, school, or social life. Medicines, counseling, and self-care can all help.  Follow-up care is a key part of your treatment and safety. Be sure to make and go to all appointments, and call your doctor if you are having problems. It's also a good idea to know your test results and keep a list of the medicines you take.  How can you care for yourself at home?  ?? Take medicines exactly as directed. Call your doctor if you think you are having a problem with your medicine.  ?? Go to your counseling sessions and follow-up appointments.  ?? Recognize and accept your anxiety. Then, when you are in a situation that makes you anxious, say to yourself, "This is not an emergency. I feel uncomfortable, but I am not in danger. I can keep going even if I feel anxious."  ?? Be kind to your body:  ?? Relieve tension with exercise or a massage.  ?? Get enough rest.  ?? Avoid alcohol, caffeine, nicotine, and illegal drugs. They can increase your anxiety level and cause sleep problems.  ?? Learn and do relaxation techniques. See below for more about these techniques.  ?? Engage your mind. Get out and do something you enjoy. Go to a funny movie, or take a walk or hike. Plan your day. Having too much or too little to do can make you anxious.  ?? Keep a record of your symptoms. Discuss your fears with a good friend or family member, or join a support group for people with similar problems. Talking to others sometimes relieves stress.   ?? Get involved in social groups, or volunteer to help others. Being alone sometimes makes things seem worse than they are.  ?? Get at least 30 minutes of exercise on most days of the week to relieve stress. Walking is a good choice. You also may want to do other activities, such as running, swimming, cycling, or playing tennis or team sports.  Relaxation techniques  Do relaxation exercises 10 to 20 minutes a day. You can play soothing, relaxing music while you do them, if you wish.  ?? Tell others in your house that you are going to do your relaxation exercises. Ask them not to disturb you.  ?? Find a comfortable place, away from all distractions and noise.  ?? Lie down on your back, or sit with your back straight.  ?? Focus on your breathing. Make it slow and steady.  ?? Breathe in through your nose. Breathe out through either your nose or mouth.  ?? Breathe deeply, filling up the area between your navel and your rib cage. Breathe so that your belly goes up and down.  ?? Do not hold your breath.  ?? Breathe like this for 5 to 10 minutes. Notice the feeling of calmness throughout your whole body.  As you continue to breathe slowly and deeply, relax by doing the following for another 5 to 10 minutes:  ?? Tighten and relax each muscle group in your body. You can begin at your toes and   work your way up to your head.  ?? Imagine your muscle groups relaxing and becoming heavy.  ?? Empty your mind of all thoughts.  ?? Let yourself relax more and more deeply.  ?? Become aware of the state of calmness that surrounds you.  ?? When your relaxation time is over, you can bring yourself back to alertness by moving your fingers and toes and then your hands and feet and then stretching and moving your entire body. Sometimes people fall asleep during relaxation, but they usually wake up shortly afterward.  ?? Always give yourself time to return to full alertness before you drive a  car or do anything that might cause an accident if you are not fully alert. Never play a relaxation tape while you drive a car.  When should you call for help?  Call 911 anytime you think you may need emergency care. For example, call if:  ? ?? You feel you cannot stop from hurting yourself or someone else.   ?Keep the numbers for these national suicide hotlines: 1-800-273-TALK (1-800-273-8255) and 1-800-SUICIDE (1-800-784-2433). If you or someone you know talks about suicide or feeling hopeless, get help right away.  ?Watch closely for changes in your health, and be sure to contact your doctor if:  ? ?? You have anxiety or fear that affects your life.   ? ?? You have symptoms of anxiety that are new or different from those you had before.   Where can you learn more?  Go to http://www.healthwise.net/GoodHelpConnections.  Enter P754 in the search box to learn more about "Anxiety Disorder: Care Instructions."  Current as of: Sep 11, 2015  Content Version: 11.4  ?? 2006-2017 Healthwise, Incorporated. Care instructions adapted under license by [employer] (which disclaims liability or warranty for this information). If you have questions about a medical condition or this instruction, always ask your healthcare professional. Healthwise, Incorporated disclaims any warranty or liability for your use of this information.       Insomnia: Care Instructions  Your Care Instructions    Insomnia is the inability to sleep well. It is a common problem for most people at some time. Insomnia may make it hard for you to get to sleep, stay asleep, or sleep as long as you need to. This can make you tired and grouchy during the day. It can also make you forgetful, less effective at work, and unhappy.  Insomnia can be caused by conditions such as depression or anxiety. Pain can also affect your ability to sleep. When these problems are solved, the insomnia usually clears up. But sometimes bad sleep habits can cause insomnia.   If insomnia is affecting your work or your enjoyment of life, you can take steps to improve your sleep.  Follow-up care is a key part of your treatment and safety. Be sure to make and go to all appointments, and call your doctor if you are having problems. It's also a good idea to know your test results and keep a list of the medicines you take.  How can you care for yourself at home?  What to avoid  ?? Do not have drinks with caffeine, such as coffee or black tea, for 8 hours before bed.  ?? Do not smoke or use other types of tobacco near bedtime. Nicotine is a stimulant and can keep you awake.  ?? Avoid drinking alcohol late in the evening, because it can cause you to wake in the middle of the night.  ??   Do not eat a big meal close to bedtime. If you are hungry, eat a light snack.  ?? Do not drink a lot of water close to bedtime, because the need to urinate may wake you up during the night.  ?? Do not read or watch TV in bed. Use the bed only for sleeping and sexual activity.  What to try  ?? Go to bed at the same time every night, and wake up at the same time every morning. Do not take naps during the day.  ?? Keep your bedroom quiet, dark, and cool.  ?? Sleep on a comfortable pillow and mattress.  ?? If watching the clock makes you anxious, turn it facing away from you so you cannot see the time.  ?? If you worry when you lie down, start a worry book. Well before bedtime, write down your worries, and then set the book and your concerns aside.  ?? Try meditation or other relaxation techniques before you go to bed.  ?? If you cannot fall asleep, get up and go to another room until you feel sleepy. Do something relaxing. Repeat your bedtime routine before you go to bed again.  ?? Make your house quiet and calm about an hour before bedtime. Turn down the lights, turn off the TV, log off the computer, and turn down the volume on music. This can help you relax after a busy day.  When should you call for help?   Watch closely for changes in your health, and be sure to contact your doctor if:  ? ?? Your efforts to improve your sleep do not work.   ? ?? Your insomnia gets worse.   ? ?? You have been feeling down, depressed, or hopeless or have lost interest in things that you usually enjoy.   Where can you learn more?  Go to http://www.healthwise.net/GoodHelpConnections.  Enter P513 in the search box to learn more about "Insomnia: Care Instructions."  Current as of: November 25, 2014  Content Version: 11.4  ?? 2006-2017 Healthwise, Inc[employer]. Care instructions adapted under license by [employer] (which disclaims liability or warranty for this information). If you have questions about a medical condition or this instruction, always ask your healthcare professional. Healthwise, Incorporated disclaims any warranty or liability for your use of this information.

## 2016-08-16 NOTE — Progress Notes (Signed)
Chief Complaint   Patient presents with   ??? Anxiety     Patient is here today for medication F/U. Pt will need a referral for right leg pain to ortho.    Pt sts last pap done at [facility] in 12/17.    1. Have you been to the ER, urgent care clinic since your last visit?  Hospitalized since your last visit?No    2. Have you seen or consulted any other health care providers outside of the [facility] since your last visit?  Include any pap smears or colon screening. No

## 2016-08-16 NOTE — ACP (Advance Care Planning) (Signed)
Do you have an Advance Care Planning?NO  Would you like to receive some information about ACP? NO

## 2016-08-24 ENCOUNTER — Encounter: Attending: Family | Primary: Family

## 2016-08-24 ENCOUNTER — Encounter: Attending: Specialist | Primary: Family

## 2016-09-08 ENCOUNTER — Encounter: Attending: Specialist | Primary: Family

## 2016-09-13 ENCOUNTER — Encounter: Attending: Family | Primary: Family

## 2016-09-19 ENCOUNTER — Ambulatory Visit: Admit: 2016-09-19 | Discharge: 2016-09-19 | Payer: PRIVATE HEALTH INSURANCE | Attending: Specialist | Primary: Family

## 2016-09-19 DIAGNOSIS — S82839A Other fracture of upper and lower end of unspecified fibula, initial encounter for closed fracture: Secondary | ICD-10-CM

## 2016-09-19 NOTE — Patient Instructions (Signed)
Ankle Fracture: Rehab Exercises  Your Care Instructions  Here are some examples of typical rehabilitation exercises for your condition. Start each exercise slowly. Ease off the exercise if you start to have pain.  Your doctor or physical therapist will tell you when you can start these exercises and which ones will work best for you.  How to do the exercises  Calf stretch (knee straight)    For this exercise, you will need a towel.  1. Sit with your affected leg straight and supported on the floor. Your other leg should be bent, with that foot flat on the floor.  2. Place a towel around your affected foot just under the toes.  3. Hold one end of the towel in each hand, with your hands above your knees.  4. Pull back gently with the towel so that your foot stretches toward you.  5. Hold the position for at least 15 to 30 seconds.  6. Repeat 2 to 4 times a session, up to 5 sessions a day.  Calf stretch (knee bent)    For this exercise, you will need a towel. You will also need a pillow or foam roll.  1. Sit with your affected leg straight and supported on the floor. Your other leg should be bent, with that foot flat on the floor.  2. Place a pillow or foam roll under your affected leg.  3. Place a towel around your affected foot just under the toes.  4. Hold one end of the towel in each hand, with your hands above your knees.  5. Pull back gently with the towel so that your foot stretches toward you.  6. Hold the position for at least 15 to 30 seconds.  7. Repeat 2 to 4 times a session, up to 5 sessions a day.  Ankle plantar flexion    1. Sit with your affected leg straight and supported on the floor. Your other leg should be bent, with that foot flat on the floor.  2. Keeping your affected leg straight, gently flex your foot downward so your toes are pointed away from your body. Then slowly relax your foot to the starting position.  3. Repeat 8 to 12 times.  Ankle dorsiflexion     1. Sit with your affected leg straight and supported on the floor. Your other leg should be bent, with that foot flat on the floor.  2. Keeping your affected leg straight, gently flex your foot back toward your body so your toes point upward. Then slowly relax your foot to the starting position.  3. Repeat 8 to 12 times.  Resisted ankle plantar flexion    For the next four exercises, you will need elastic exercise material, such as surgical tubing or Thera-Band.  1. Sit with your affected leg straight and supported on the floor. Your other leg should be bent, with that foot flat on the floor.  2. Place an elastic band around your affected foot just under the toes.  3. Hold each end of the band in each hand, with your hands above your knees.  4. Keeping your affected leg straight, gently flex your foot downward so your toes are pointed away from your body. Then slowly relax your foot to the starting position.  5. Repeat 8 to 12 times.  Resisted ankle dorsiflexion    1. Tie the ends of an exercise band together to form a loop. Attach one end of the loop to a secure object, like a  table leg, or shut a door on it to hold it in place. (Or you can have someone hold one end of the loop to provide resistance.)  2. While sitting on the floor or in a chair, loop the other end of the band over the top of your affected foot.  3. Keeping your knee and leg straight, slowly flex your foot toward you to pull back on the exercise band, and then slowly relax.  4. Repeat 8 to 12 times.  Resisted ankle inversion    1. Sit on the floor with your good leg crossed over your other leg.  2. Hold both ends of an exercise band and loop the band around the inside of your affected foot. Then press your good foot against the band.  3. Keeping your legs crossed, slowly push your affected foot against the band so that foot moves away from your good foot. Then slowly relax.  4. Repeat 8 to 12 times.  Resisted ankle eversion     1. Sit on the floor with your legs straight.  2. Hold both ends of an exercise band and loop the band around the outside of your affected foot. Then press your good foot against the band.  3. Keeping your leg straight, slowly push your affected foot outward against the band and away from your good foot without letting your leg rotate. Then slowly relax.  4. Repeat 8 to 12 times.  Ankle alphabet    1. Sit in a chair with your feet flat on the floor. (You can also do this exercise lying on your back with your affected leg propped up on a pillow).  2. Lift the heel of your affected foot off the floor, and slowly trace the letters of the alphabet.  Heel raises    1. Stand with your feet a few inches apart, with your hands lightly resting on a counter or chair in front of you.  2. Slowly raise your heels off the floor while keeping your knees straight.  3. Hold for about 6 seconds, then slowly lower your heels to the floor.  4. Do 8 to 12 repetitions several times during the day.  Follow-up care is a key part of your treatment and safety. Be sure to make and go to all appointments, and call your doctor if you are having problems. It's also a good idea to know your test results and keep a list of the medicines you take.  Where can you learn more?  Go to [employer]Stats[employer]cahttp://www.healthwise.net/GoodHelpConnections.  Enter T800 in the search box to learn more about "Ankle Fracture: Rehab Exercises."  Current as of: July 21, 2015  Content Version: 11.4  ?? 2006-2017 Healthwise, Incorporated. Care instructions adapted under license by [employer] (which disclaims liability or warranty for this information). If you have questions about a medical condition or this instruction, always ask your healthcare professional. Healthwise, Incorporated disclaims any warranty or liability for your use of this information.
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
## 2016-09-20 ENCOUNTER — Encounter: Attending: Neurology | Primary: Family

## 2016-09-21 ENCOUNTER — Encounter: Attending: Foot and Ankle Surgery | Primary: Family

## 2016-09-21 ENCOUNTER — Encounter: Attending: Neurology | Primary: Family

## 2016-09-28 ENCOUNTER — Encounter: Attending: Foot and Ankle Surgery | Primary: Family

## 2016-10-03 NOTE — Telephone Encounter (Signed)
I called Pt in regards to message from Margo:    Can you please speak with patient and advised I received a message from neurology informing she did not keep her scheduled appt and she will need to call and reschedule. ??Thanks, Doristine SectionMargo B Smith, NP-C.    Pt sts that she hasn't recvd a call from this office.  Called the office and informed them to please contact Pt at this Ph# [phone].  Per Neo he will contact her in regards to Appt.

## 2016-10-05 NOTE — Telephone Encounter (Signed)
Pt request letter/prescription for apt complex. Stated she has had a companion pet due to anxiety for the past two years and her apartment complex just informed her they need that updated yearly     It is required by the complex for having a pet in the apartment

## 2016-10-07 NOTE — Telephone Encounter (Signed)
Spoke with patient and states she has companion cat to help her with her anxiety.  Patient requesting letter be faxed to [phone] attn: [facility].

## 2016-10-10 NOTE — Telephone Encounter (Signed)
[facility] Print[facility] out and [facility] [phone].

## 2016-10-12 ENCOUNTER — Encounter: Attending: Family | Primary: Family

## 2016-10-17 ENCOUNTER — Ambulatory Visit: Admit: 2016-10-17 | Discharge: 2016-10-17 | Payer: PRIVATE HEALTH INSURANCE | Attending: Family | Primary: Family

## 2016-10-17 DIAGNOSIS — F17201 Nicotine dependence, unspecified, in remission: Secondary | ICD-10-CM

## 2016-10-17 MED ORDER — CLONAZEPAM 0.25 MG TAB, RAPID DISSOLVE
0.25 mg | ORAL_TABLET | ORAL | 0 refills | Status: DC
Start: 2016-10-17 — End: 2016-12-08

## 2016-10-17 MED ORDER — OMEPRAZOLE 20 MG CAP, DELAYED RELEASE
20 mg | ORAL_CAPSULE | Freq: Every day | ORAL | 1 refills | Status: DC
Start: 2016-10-17 — End: 2016-12-08

## 2016-10-17 MED ORDER — POLYETHYLENE GLYCOL 3350 100 % ORAL POWDER
17 gram/dose | Freq: Every day | ORAL | 1 refills | Status: DC | PRN
Start: 2016-10-17 — End: 2016-12-08

## 2016-10-17 NOTE — Progress Notes (Signed)
Chief Complaint   Patient presents with   ??? Nicotine Dependence     Patient is here today for F/U.  Pt would like to like a referral to psy asap.  Sts that she has been having panic attacks daily and she is out of the medication.    1. Have you been to the ER, urgent care clinic since your last visit?  Hospitalized since your last visit?No    2. Have you seen or consulted any other health care providers outside of the [facility] since your last visit?  Include any pap smears or colon screening. No

## 2016-10-17 NOTE — Patient Instructions (Addendum)
Panic Attacks: Care Instructions  Your Care Instructions    During a panic attack, you may have a feeling of intense fear or terror, trouble breathing, chest pain or tightness, heartbeat changes, dizziness, sweating, and shaking. A panic attack starts suddenly and usually lasts from 5 to 20 minutes but may last even longer. You have the most anxiety about 10 minutes after the attack starts. An attack can begin with a stressful event, or it can happen without a cause.  Although panic attacks can cause scary symptoms, you can learn to manage them with self-care, counseling, and medicine.  Follow-up care is a key part of your treatment and safety. Be sure to make and go to all appointments, and call your doctor if you are having problems. It's also a good idea to know your test results and keep a list of the medicines you take.  How can you care for yourself at home?  ?? Take your medicine exactly as directed. Call your doctor if you think you are having a problem with your medicine.  ?? Go to your counseling sessions and follow-up appointments.  ?? Recognize and accept your anxiety. Then, when you are in a situation that makes you anxious, say to yourself, "This is not an emergency. I feel uncomfortable, but I am not in danger. I can keep going even if I feel anxious."  ?? Be kind to your body:  ?? Relieve tension with exercise or a massage.  ?? Get enough rest.  ?? Avoid alcohol, caffeine, nicotine, and illegal drugs. They can increase your anxiety level, cause sleep problems, or trigger a panic attack.  ?? Learn and do relaxation techniques. See below for more about these techniques.  ?? Engage your mind. Get out and do something you enjoy. Go to a funny movie, or take a walk or hike. Plan your day. Having too much or too little to do can make you anxious.  ?? Keep a record of your symptoms. Discuss your fears with a good friend or family member, or join a support group for people with similar problems.  Talking to others sometimes relieves stress.  ?? Get involved in social groups, or volunteer to help others. Being alone sometimes makes things seem worse than they are.  ?? Get at least 30 minutes of exercise on most days of the week to relieve stress. Walking is a good choice. You also may want to do other activities, such as running, swimming, cycling, or playing tennis or team sports.  Relaxation techniques  Do relaxation exercises for 10 to 20 minutes a day. You can play soothing, relaxing music while you do them, if you wish.  ?? Tell others in your house that you are going to do your relaxation exercises. Ask them not to disturb you.  ?? Find a comfortable place, away from all distractions and noise.  ?? Lie down on your back, or sit with your back straight.  ?? Focus on your breathing. Make it slow and steady.  ?? Breathe in through your nose. Breathe out through either your nose or mouth.  ?? Breathe deeply, filling up the area between your navel and your rib cage. Breathe so that your belly goes up and down.  ?? Do not hold your breath.  ?? Breathe like this for 5 to 10 minutes. Notice the feeling of calmness throughout your whole body.  As you continue to breathe slowly and deeply, relax by doing the following for another 5 to 10 minutes:  ??   Tighten and relax each muscle group in your body. You can begin at your toes and work your way up to your head.  ?? Imagine your muscle groups relaxing and becoming heavy.  ?? Empty your mind of all thoughts.  ?? Let yourself relax more and more deeply.  ?? Become aware of the state of calmness that surrounds you.  ?? When your relaxation time is over, you can bring yourself back to alertness by moving your fingers and toes and then your hands and feet and then stretching and moving your entire body. Sometimes people fall asleep during relaxation, but they usually wake up shortly afterward.  ?? Always give yourself time to return to full alertness before you drive a  car or do anything that might cause an accident if you are not fully alert. Never play a relaxation tape while driving a car.  When should you call for help?  Call 911 anytime you think you may need emergency care. For example, call if:  ? ?? You feel you cannot stop from hurting yourself or someone else.   ?Watch closely for changes in your health, and be sure to contact your doctor if:  ? ?? Your panic attacks get worse.   ? ?? You have new or different anxiety.   ? ?? You are not getting better as expected.   Where can you learn more?  Go to http://www.healthwise.net/GoodHelpConnections.  Enter H601 in the search box to learn more about "Panic Attacks: Care Instructions."  Current as of: Sep 11, 2015  Content Version: 11.4  ?? 2006-2017 [employer]. Care instructions adapted under license by [employer] (which disclaims liability or warranty for this information). If you have questions about a medical condition or this instruction, always ask your healthcare professional. [employer] disclaims any warranty or liability for your use of this information.

## 2016-10-17 NOTE — Progress Notes (Signed)
HISTORY OF PRESENT ILLNESS    Lacey [name] is a 26 y.o. year old female comes in today to be evaluated and treated for:  Tobacco abuse    Patient reports she has stopped smoking since beginning chantix.  Comments she continues to take medication without any adverse effects.  Has not smoked a cigarette in approx. 1 week.  States she is currently vaping most days  Abd: states she has hx of IBS and bloating.  Comments she was scheduled to have an endoscopy performed in [place] but moved to [place] before she was able to have the procedure completed.  Anxiety: reports she has been having increased panic attacks.  States she was previously taking xanax with improvement.  Comments she has almost lost her job due to panic attacks.  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  Patient states when she was seen by her previous psychiatrist and per patient she had multiple testing performed.  Per patient the regimen she that worked was adderall 15mg  to take as needed for focus and also xanax 0.5mg  as needed.  Reports she has tried and failed gabaptenin and vistaril    No Known Allergies  Current Outpatient Prescriptions   Medication Sig Dispense Refill   ??? traZODone (DESYREL) 50 mg tablet Take 1 Tab by mouth nightly. 30 Tab 2   ??? amitriptyline (ELAVIL) 100 mg tablet Take 2 Tabs by mouth nightly. 60 Tab 2   ??? varenicline (CHANTIX) 1 mg tablet Take 1 Tab by mouth two (2) times a day. 60 Tab 2   ??? ALPRAZolam (XANAX) 0.5 mg tablet Take  by mouth.     ??? varenicline (CHANTIX STARTER PAK) 0.5 mg (11)- 1 mg (42) DsPk Take as directed on package 1 Dose Pack 0     Past Medical History:   Diagnosis Date   ??? Anxiety    ??? Panic disorder        ROS:  Review of Systems - General ROS: negative for - chills or fever  Psychological ROS: positive for - anxiety  Respiratory ROS: no cough, shortness of breath, or wheezing  Cardiovascular ROS: no chest pain or dyspnea on exertion        Objective:  Visit Vitals    ??? BP 130/77 (BP 1 Location: Left arm, BP Patient Position: Sitting)   ??? Pulse (!) 123   ??? Temp 99.1 ??F (37.3 ??C) (Oral)   ??? Resp 16   ??? Ht 5\' 5"  (1.651 m)   ??? Wt 238 lb 9.6 oz (108.2 kg)   ??? SpO2 97%   ??? BMI 39.71 kg/m2     General appearance - alert, well appearing, and in no distress  Neck - supple, no significant adenopathy  Chest - clear to auscultation, no wheezes, rales or rhonchi, symmetric air entry  Heart - normal rate, regular rhythm, normal S1, S2, no murmurs, rubs, clicks or gallops  Abdomen: soft, non-tender. Bowel sounds normal. No masses,  no organomegaly, abnormal findings:  tenderness mild in the epigastrium  Extremities: extremities normal, atraumatic, no cyanosis or edema  Pulses: 2+ and symmetric  Skin: Skin color, texture, turgor normal. No rashes or lesions or ecchymoses - lower leg(s) bilateral, ankle(s) bilateral          Assessment/Plan:     ICD-10-CM ICD-9-CM    1. History of IBS Z87.19 V12.79 REFERRAL TO GASTROENTEROLOGY   2. Anxiety F41.9 300.00 REFERRAL TO PSYCHIATRY   3. Tobacco abuse, in remission F17.201 V15.82  4. Panic attacks F41.0 300.01 clonazePAM (KLONOPIN) 0.25 mg disintegrating tablet   5. Gastroesophageal reflux disease, esophagitis presence not specified K21.9 530.81 omeprazole (PRILOSEC) 20 mg capsule   6. Constipation, unspecified constipation type K59.00 564.00 polyethylene glycol (MIRALAX) 17 gram/dose powder   PMP reviewed and there is no current information available  Given contact information for psych, instructed to call psych as soon as possible for medication management of anxiety.  I have discussed the diagnosis with the patient and the intended plan as seen in the above orders.  The patient has received an after-visit summary and questions were answered concerning future plans.  I have discussed medication side effects and warnings with the patient as well.  Patient agreeable with above plan and verbalizes understanding.  Follow-up Disposition:   Return in about 4 weeks (around 11/14/2016) for anxiety.

## 2016-10-24 ENCOUNTER — Encounter: Attending: Nurse Practitioner | Primary: Family

## 2016-12-08 ENCOUNTER — Ambulatory Visit: Admit: 2016-12-08 | Discharge: 2016-12-08 | Payer: PRIVATE HEALTH INSURANCE | Attending: Family | Primary: Family

## 2016-12-08 DIAGNOSIS — Z79899 Other long term (current) drug therapy: Secondary | ICD-10-CM

## 2016-12-08 MED ORDER — PROPRANOLOL 20 MG TAB
20 mg | ORAL_TABLET | Freq: Two times a day (BID) | ORAL | 2 refills | Status: DC
Start: 2016-12-08 — End: 2017-08-16

## 2016-12-08 MED ORDER — POLYETHYLENE GLYCOL 3350 100 % ORAL POWDER
17 gram/dose | Freq: Every day | ORAL | 1 refills | Status: DC | PRN
Start: 2016-12-08 — End: 2017-08-16

## 2016-12-08 MED ORDER — AMITRIPTYLINE 100 MG TAB
100 mg | ORAL_TABLET | Freq: Every evening | ORAL | 2 refills | Status: DC
Start: 2016-12-08 — End: 2017-03-16

## 2016-12-08 MED ORDER — CLONAZEPAM 0.5 MG TAB, RAPID DISSOLVE
0.5 mg | ORAL_TABLET | Freq: Two times a day (BID) | ORAL | 0 refills | Status: DC | PRN
Start: 2016-12-08 — End: 2019-06-19

## 2016-12-08 MED ORDER — VARENICLINE 1 MG TAB
1 mg | ORAL_TABLET | Freq: Two times a day (BID) | ORAL | 2 refills | Status: DC
Start: 2016-12-08 — End: 2017-08-16

## 2016-12-08 MED ORDER — TRAZODONE 50 MG TAB
50 mg | ORAL_TABLET | Freq: Every evening | ORAL | 2 refills | Status: DC
Start: 2016-12-08 — End: 2017-03-16

## 2016-12-08 MED ORDER — OMEPRAZOLE 20 MG CAP, DELAYED RELEASE
20 mg | ORAL_CAPSULE | Freq: Every day | ORAL | 1 refills | Status: DC
Start: 2016-12-08 — End: 2017-03-16

## 2016-12-08 NOTE — Progress Notes (Signed)
Pt is here for follow up on anxiety    1. Have you been to the ER, urgent care clinic since your last visit?  Hospitalized since your last visit?No    2. Have you seen or consulted any other health care providers outside of the [facility] since your last visit?  Include any pap smears or colon screening. No

## 2016-12-08 NOTE — Patient Instructions (Signed)
Learning About Anxiety Disorders  What are anxiety disorders?    Anxiety disorders are a type of medical problem. They cause severe anxiety. When you feel anxious, you feel that something bad is about to happen. This feeling interferes with your life.  These disorders include:  ?? Generalized anxiety disorder. You feel worried and stressed about many everyday events and activities. This goes on for several months and disrupts your life on most days.  ?? Panic disorder. You have repeated panic attacks. A panic attack is a sudden, intense fear or anxiety. It may make you feel short of breath. Your heart may pound.  ?? Social anxiety disorder. You feel very anxious about what you will say or do in front of people. For example, you may be scared to talk or eat in public. This problem affects your daily life.  ?? Phobias. You are very scared of a specific object, situation, or activity. For example, you may fear spiders, high places, or small spaces.  What are the symptoms?  Generalized anxiety disorder  Symptoms may include:  ?? Feeling worried and stressed about many things almost every day.  ?? Feeling tired or irritable. You may have a hard time concentrating.  ?? Having headaches or muscle aches.  ?? Having a hard time getting to sleep or staying asleep.  Panic disorder  You may have repeated panic attacks when there is no reason for feeling afraid. You may change your daily activities because you worry that you will have another attack.  Symptoms may include:  ?? Intense fear, terror, or anxiety.  ?? Trouble breathing or very fast breathing.  ?? Chest pain or tightness.  ?? A heartbeat that races or is not regular.  Social anxiety disorder  Symptoms may include:  ?? Fear about a social situation, such as eating in front of others or speaking in public. You may worry a lot. Or you may be afraid that something bad will happen.  ?? Anxiety that can cause you to blush, sweat, and feel shaky.   ?? A heartbeat that is faster than normal.  ?? A hard time focusing.  Phobias  Symptoms may include:  ?? More fear than most people of being around an object, being in a situation, or doing an activity. You might also be stressed about the chance of being around the thing you fear.  ?? Worry about losing control, panicking, fainting, or having physical symptoms like a faster heartbeat when you are around the situation or object.  How are these disorders treated?  Anxiety disorders can be treated with medicines or counseling. A combination of both may be used.  Medicines may include:  ?? Antidepressants. These may help your symptoms by keeping chemicals in your brain in balance.  ?? Benzodiazepines. These may give you short-term relief of your symptoms.  Some people use cognitive-behavioral therapy. A therapist helps you learn to change stressful or bad thoughts into helpful thoughts.  Lead a healthy lifestyle  A healthy lifestyle may help you feel better.  ?? Get at least 30 minutes of exercise on most days of the week. Walking is a good choice.  ?? Eat a healthy diet. Include fruits, vegetables, lean proteins, and whole grains in your diet each day.  ?? Try to go to bed at the same time every night. Try for 8 hours of sleep a night.  ?? Find ways to manage stress. Try relaxation exercises.  ?? Avoid alcohol and illegal drugs.  Follow-up care is   a key part of your treatment and safety. Be sure to make and go to all appointments, and call your doctor if you are having problems. It's also a good idea to know your test results and keep a list of the medicines you take.  Where can you learn more?  Go to http://www.healthwise.net/GoodHelpConnections.  Enter K667 in the search box to learn more about "Learning About Anxiety Disorders."  Current as of: April 07, 2016  Content Version: 11.7  ?? 2006-2018 [employer]. Care instructions adapted under license by [employer] (which disclaims liability or warranty  for this information). If you have questions about a medical condition or this instruction, always ask your healthcare professional. [employer] disclaims any warranty or liability for your use of this information.

## 2016-12-08 NOTE — Progress Notes (Signed)
HISTORY OF PRESENT ILLNESS    Lacey [name] is a 26 y.o. year old female here today to follow up for:  Anxiety    Patients symptoms have gotten worse.  Pt states that the anxiety has gotten worse over the last month and she only received 2 weeks worth of medication. Pt states that she just started a new job and is about to lose it because of her anxiety.  Reports she is currently going through a bad divorce which has been present for the last 5 months.  States she has stopped smoking cigarettes and drinking caffeine.  Vistaril, zoloft, wellbutrin, buspar were medications she tried in the past but either weren't effective of caused sided effects.  Comments she has been trying to get in with psychiatry but reports she has not been able to get through when she calls and has not received a return call when she has left a message.    No Known Allergies  Current Outpatient Prescriptions   Medication Sig Dispense Refill   ??? omeprazole (PRILOSEC) 20 mg capsule Take 1 Cap by mouth Daily (before breakfast). 30 Cap 1   ??? polyethylene glycol (MIRALAX) 17 gram/dose powder Take 17 g by mouth daily as needed. 510 g 1   ??? traZODone (DESYREL) 50 mg tablet Take 1 Tab by mouth nightly. 30 Tab 2   ??? amitriptyline (ELAVIL) 100 mg tablet Take 2 Tabs by mouth nightly. 60 Tab 2   ??? varenicline (CHANTIX) 1 mg tablet Take 1 Tab by mouth two (2) times a day. 60 Tab 2   ??? clonazePAM (KLONOPIN) 0.25 mg disintegrating tablet 1 tablet twice a day as needed for anxiety, after 3 days may increase to 2 tabs twice a day as needed for anxiety 30 Tab 0   ??? varenicline (CHANTIX STARTER PAK) 0.5 mg (11)- 1 mg (42) DsPk Take as directed on package 1 Dose Pack 0     Past Medical History:   Diagnosis Date   ??? Anxiety    ??? Panic disorder        ROS:  Review of Systems - General ROS: negative  Respiratory ROS: no cough, shortness of breath, or wheezing  Cardiovascular ROS: no chest pain or dyspnea on exertion  Musculoskeletal ROS: positive for - lower back pain    Psychological ROS: positive for - anxiety        Objective:  Visit Vitals   ??? BP 126/88 (BP 1 Location: Left arm)   ??? Pulse (!) 113   ??? Temp 98.6 ??F (37 ??C) (Oral)   ??? Resp 16   ??? Ht 5\' 5"  (1.651 m)   ??? Wt 235 lb 9.6 oz (106.9 kg)   ??? LMP 11/19/2016 (Exact Date)   ??? SpO2 97%   ??? BMI 39.21 kg/m2     General appearance - alert, well appearing, and in no distress  Neck - supple, no significant adenopathy  Chest - clear to auscultation, no wheezes, rales or rhonchi, symmetric air entry  Heart - normal rate, regular rhythm, normal S1, S2, no murmurs, rubs, clicks or gallops  Extremities: extremities normal, atraumatic, no cyanosis or edema        Assessment/Plan:     ICD-10-CM ICD-9-CM    1. Long-term use of high-risk medication Z79.899 V58.69    2. Gastroesophageal reflux disease, esophagitis presence not specified K21.9 530.81 omeprazole (PRILOSEC) 20 mg capsule   3. Constipation, unspecified constipation type K59.00 564.00 polyethylene glycol (MIRALAX) 17 gram/dose powder   4.  Insomnia, unspecified type G47.00 780.52 traZODone (DESYREL) 50 mg tablet   5. Tobacco abuse Z72.0 305.1 varenicline (CHANTIX) 1 mg tablet   6. Panic attacks F41.0 300.01 clonazePAM (KLONOPIN) 0.5 mg TbDi disintegrating tablet   instructed to go to psychiatrist office to schedule an appt  Nurse called [facility] and verified refill history of clonazepam.  Refills consistent with prescribed usage.  I have discussed the diagnosis with the patient and the intended plan as seen in the above orders.  The patient has received an after-visit summary and questions were answered concerning future plans.  I have discussed medication side effects and warnings with the patient as well.  Pt verbalizes understanding.    Follow-up Disposition:  Return in about 4 weeks (around 01/05/2017) for anxiety.

## 2016-12-29 NOTE — Telephone Encounter (Signed)
Patient calling says she dropped off a form 2 weeks ago to be filled out for her companion animal. Says apartment complex stated they never received it and it was due last week. Patient needs that form to be faxed over. Apartment complex said they would be putting her out if they didn't have completed form. Says they received the letter that was done. Patient would like a call back regarding form

## 2016-12-29 NOTE — Telephone Encounter (Signed)
[facility]

## 2017-01-05 ENCOUNTER — Encounter: Attending: Family | Primary: Family

## 2017-03-16 ENCOUNTER — Encounter

## 2017-03-16 NOTE — Telephone Encounter (Addendum)
Pt called in requesting refill of her   Requested Prescriptions     Pending Prescriptions Disp Refills   ??? amitriptyline (ELAVIL) 100 mg tablet 60 Tab 2     Sig: Take 2 Tabs by mouth nightly.   ??? clonazePAM (KLONOPIN) 0.5 mg TbDi disintegrating tablet 60 Tab 0     Sig: Take 1 Tab by mouth two (2) times daily as needed. Max Daily Amount: 1 mg.   ??? traZODone (DESYREL) 50 mg tablet 30 Tab 2     Sig: Take 1 Tab by mouth nightly.   ??? omeprazole (PRILOSEC) 20 mg capsule 30 Cap 1     Sig: Take 1 Cap by mouth Daily (before breakfast).   .Marland Kitchen

## 2017-03-17 MED ORDER — AMITRIPTYLINE 100 MG TAB
100 mg | ORAL_TABLET | Freq: Every evening | ORAL | 2 refills | Status: DC
Start: 2017-03-17 — End: 2017-08-16

## 2017-03-17 MED ORDER — TRAZODONE 50 MG TAB
50 mg | ORAL_TABLET | Freq: Every evening | ORAL | 2 refills | Status: DC
Start: 2017-03-17 — End: 2017-06-22

## 2017-03-17 MED ORDER — OMEPRAZOLE 20 MG CAP, DELAYED RELEASE
20 mg | ORAL_CAPSULE | Freq: Every day | ORAL | 1 refills | Status: DC
Start: 2017-03-17 — End: 2017-06-22

## 2017-03-22 ENCOUNTER — Encounter: Attending: Family | Primary: Family

## 2017-04-13 ENCOUNTER — Encounter: Attending: Neurology | Primary: Family

## 2017-04-13 ENCOUNTER — Ambulatory Visit: Admit: 2017-04-13 | Discharge: 2017-04-13 | Payer: PRIVATE HEALTH INSURANCE | Attending: Neurology | Primary: Family

## 2017-04-13 NOTE — Progress Notes (Signed)
HPI:  26 y/o female referred by Orland DecMargo Smith, NP.  History is very difficult as she is quite antagonistic, defensive, and visibly angry from the moment I entered the door.  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 Her body, her hands, her legs are swelling. She is losing feeling in her hands up to the elbows. She has collapsed about 50 times, to the point she starts feeling sick and her lips turn white, sweating profusely and then collapses.  With difficulty we get a little more detail, she describes that she had what was called for seizures when she had her now 61644-year-old son (also has a 28-year-old son), does not remember having any specific workup for this or ever seen a neurologist, denies SOB or CP with this.  About a month ago she had another spell when reportedly she became confused, lightheaded, she started sweating out of every pouring her body, she became white and then she collapsed.  She does not know what she did while she is out.  There was no urine incontinence or oral trauma.  There were no witness accounts of convulsions.  She causes a "seizure".  She reports she wakes up with migraines that may last about 20 mins, usually at 5am in am, her head is pounding. She reports symptoms of confusion, dizziness, she is getting scared as she has been a very healthy person all her life. 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 She does not depressed, but has had anxiety her whole life. Last two yrs has been worse. She has seen a psychiatrist in the past.     Social History     Socioeconomic History   ??? Marital status: MARRIED     Spouse name: Not on file   ??? Number of children: Not on file   ??? Years of education: Not on file   ??? Highest education level: Not on file   Social Needs   ??? Financial resource strain: Not on file    ??? Food insecurity - worry: Not on file   ??? Food insecurity - inability: Not on file   ??? Transportation needs - medical: Not on file   ??? Transportation needs - non-medical: Not on file   Occupational History   ??? Not on file   Tobacco Use   ??? Smoking status: Former Smoker     Packs/day: 1.50   ??? Smokeless tobacco: Current User   Substance and Sexual Activity   ??? Alcohol use: No     Comment: Social   ??? Drug use: No   ??? Sexual activity: Yes     Partners: Female   Other Topics Concern   ??? Not on file   Social History Narrative   ??? Not on file       Family History   Problem Relation Age of Onset   ??? No Known Problems Mother        Current Outpatient Medications   Medication Sig Dispense Refill   ??? amitriptyline (ELAVIL) 100 mg tablet Take 2 Tabs by mouth nightly. 60 Tab 2   ??? traZODone (DESYREL) 50 mg tablet Take 1 Tab by mouth nightly. 30 Tab 2   ??? omeprazole (PRILOSEC) 20 mg capsule Take 1 Cap by mouth Daily (before breakfast).  30 Cap 1   ??? polyethylene glycol (MIRALAX) 17 gram/dose powder Take 17 g by mouth daily as needed. 510 g 1   ??? varenicline (CHANTIX) 1 mg tablet Take 1 Tab by mouth two (2) times a day. 60 Tab 2   ??? clonazePAM (KLONOPIN) 0.5 mg TbDi disintegrating tablet Take 1 Tab by mouth two (2) times daily as needed for Anxiety. Max Daily Amount: 1 mg. 60 Tab 0   ??? propranolol (INDERAL) 20 mg tablet Take 1 Tab by mouth two (2) times a day. 60 Tab 2       Past Medical History:   Diagnosis Date   ??? Anxiety    ??? Panic disorder        Past Surgical History:   Procedure Laterality Date   ??? HX GYN  2010    C Section       No Known Allergies    Patient Active Problem List   Diagnosis Code   ??? Obesity, morbid (HCC) E66.01   ??? Anxiety F41.9   ??? Insomnia G47.00   ??? History of syncope Z87.898   ??? Abnormal weight gain R63.5         Review of Systems:   Constitutional: no fever or chills  Skin denies rash or itching  HEENT:  Denies tinnitus, hearing loss, or visual changes  Respiratory: denies shortness of breath   Cardiovascular: denies chest pain, dyspnea on exertion  Gastrointestinal: does not report nausea or vomiting  Genitourinary: does not report dysuria or incontinence  Musculoskeletal: does not report joint pain or swelling  Endocrine: denies weight change  Hematology: denies easy bruising or bleeding   Neurological: as above in HPI      PHYSICAL EXAMINATION:      VITAL SIGNS:    Visit Vitals  BP 108/78 (BP 1 Location: Right arm, BP Patient Position: Sitting)   Pulse 100   Temp 98.3 ??F (36.8 ??C) (Oral)   Resp 18   Ht 5\' 5"  (1.651 m)   Wt 106.6 kg (235 lb)   SpO2 98%   BMI 39.11 kg/m??       GENERAL: Well developed, obese, in no apparent distress.   HEART: RR, no murmurs heard, no carotid bruits  EXTREMITIES: No clubbing, cyanosis, or edema is identified.  Pulses 2+    and symmetrical.  HEAD:   Normocephalic, atraumatic.  Crowded oropharynx, enlarged tonsils    NEUROLOGIC EXAMINATION    MENTAL STATUS: Awake, alert, and oriented x 4. Attention and STM are grossly normal. There is no aphasia. Fund of knowledge is adequate.  Mood and affect are appropriate  CRANIAL NERVES: Visual fields are full to confrontation. No fundus anomalies observed. Pupils are reactive to light and accommodation.   Extraocular movements are intact and there is no nystagmus.   Facial sensation is normal  Face is symmetrical.   Hearing is present.   SCM/TPZ 5/5  Palate rises symmetrically. Tongue is in the midline.        MOTOR:   The patient is 5/5 in all four limbs without any drift.     CEREBELLAR: No tremors or dysmetria    SENSORY:  Normal PP, vibration, propioception. Romberg negative    DTR's:   +2 throughout, no long tract signs     GAIT:   Normal gait      Impression: She has a multitude of symptoms, some can be more easily explained and others.  Her hand numbness in the setting of diffuse swelling  for whatever reason and weight gain may raise concern for carpal tunnel syndrome.  Her weight gain, her sleep disturbances, fatigue, and  headaches upon awakening for 20 minutes suggests that she probably have obstructive sleep apnea.  The described episodes of syncope are not sounding epileptic to me.  She has severe insomnia and severe anxiety, and she has a very defensive and angry attitude during the interview.  I suspect there is a likely psychogenic explanation to several of her symptoms, including her possible syncopal spells, but the interaction was so difficult that I really could not get further into these issues as my line of questioning was constantly interrupted in an angry fashion.  She reports that her problems with sleeping/insomnia and anxiety have been "fixed.  I try to explain why I do not think that this is the case, as she reports tremendous variation on her sleep patterns and the need to take a massive amount of amitriptyline at 200 mg to accomplish sleep.    Plan: 1???MRI of the brain.  2??? EEG  3??? attended overnight sleep study  4???EMG and nerve conductions of the upper extremities.  5-6???discuss with primary care physician referrals to cardiology as well as psychiatry or another mental health professional as appropriate.  I do not anticipate that I will be able to find an neurological explanation to her syncopal spells and cardiac and psychiatric reasons need to be considered.      PLEASE NOTE:   Portions of this document may have been produced using voice recognition software. Unrecognized errors in transcription may be present.       This note will not be viewable in MyChart.

## 2017-04-13 NOTE — Progress Notes (Signed)
Chief Complaint   Patient presents with   ??? Migraine     C/O not feeling well and passing out a lot.   ??? Numbness     C/O having numbness to hands when waking up     PHQ over the last two weeks 04/13/2017   Little interest or pleasure in doing things Not at all   Feeling down, depressed, irritable, or hopeless Several days   Total Score PHQ 2 1

## 2017-05-09 ENCOUNTER — Encounter: Primary: Family

## 2017-05-16 ENCOUNTER — Ambulatory Visit: Admit: 2017-05-16 | Discharge: 2017-05-16 | Payer: PRIVATE HEALTH INSURANCE | Primary: Family

## 2017-05-16 DIAGNOSIS — G5603 Carpal tunnel syndrome, bilateral upper limbs: Secondary | ICD-10-CM

## 2017-05-16 NOTE — Progress Notes (Signed)
[facility]  [address]  [place] [address]  [phone]      Valinda Hoar [phone]    Neurophysiology Report    Patient: Lacey [name]     ID: [id number] Physician: Peter Minium, MD   Gender: Female Ref Phys: Peter Minium, MD   Handedness: Arvilla Meres     Study Date: May 16, 2017         Patient History:  Bilat hand numbness.       Nerve Conduction Studies  Anti Sensory Summary Table     Stim Site NR Peak (ms) Norm Peak (ms) O-P Amp (??V) Norm O-P Amp Dist (cm) Vel (m/s)   Left Median 2nd Digit Anti Sensory (2nd Digit)   Wrist    3.0 <3.5 21.3 >20 13.0 52.0   Right Median 2nd Digit Anti Sensory (2nd Digit)   Wrist    3.4 <3.5 22.3 >20 13.0 54.2   Left Ulnar Anti Sensory (5th Digit )   Wrist    2.7 <3.1 18.9 >17.0 11.0 55.0   Right Ulnar Anti Sensory (5th Digit )   Wrist    2.5 <3.1 28.2 >17.0 11.0 57.9     Motor Summary Table     Stim Site NR Onset (ms) Norm Onset (ms) O-P Amp (mV) Norm O-P Amp Dist (cm) Vel (m/s) Norm Vel (m/s)   Left Median Motor (Abd Poll Brev)   Wrist    2.7 <4.4 12.4 >4.0 25.0 69.4 >49   Elbow    6.3  12.2       Right Median Motor (Abd Poll Brev)   Wrist    3.4 <4.4 8.0 >4.0 22.0 62.9 >49   Elbow    6.9  7.6       Left Ulnar Motor (Abd Dig Minimi )   Wrist    2.6 <3.3 9.6 >6.0 20.0 76.9 >49   B Elbow    5.2  8.8  12.0 63.2 >50   A Elbow    7.1  9.9       Right Ulnar Motor (Abd Dig Minimi )   Wrist    2.5 <3.3 8.3 >6.0 21.0 75.0 >49   B Elbow    5.3  8.2  12.0 60.0 >50   A Elbow    7.3  8.3               NCS/EMG FINDINGS:    ? Evaluation of the Left median motor, the Right median motor, the Left ulnar motor, the Right ulnar motor, the Left Median 2nd Digit sensory, the Right Median 2nd Digit sensory, the Left ulnar sensory, and the Right ulnar sensory nerves were unremarkable.      INTERPRETATION:   Normal NCS of bilateral upper extremities. EMG deferred due to expected low yield.     ___________________________  Peter Minium, MD          Waveforms:                     [facility] [facility] PROCEDURE PROGRESS NOTE        Chart reviewed for the following:   I, Ivy Lynn, MD, have reviewed the History, Physical and updated the Allergic reactions for [facility]     TIME OUT performed immediately prior to start of procedure:   I, Ivy Lynn, MD, have performed the following reviews on Amylynn Tufo prior to the start of the procedure:            *  Patient was identified by name and date of birth   * Agreement on procedure being performed was verified  * Risks and Benefits explained to the patient  * Procedure site verified and marked as necessary  * Patient was positioned for comfort  * Consent was signed and verified     Time: 1:46 PM    Date of procedure: 05/16/2017    Procedure performed by:  PROCEDURE ROOM    Provider assisted by: Karren CobbleB Campbell     Patient assisted by: self    How tolerated by patient: tolerated the procedure well with no complications    Post Procedural Pain Scale: 0 - No Hurt    Comments: none

## 2017-05-16 NOTE — Progress Notes (Signed)
Lacey Jarvisiffany Shinsky presents today for   Chief Complaint   Patient presents with   ??? Procedure     BUE       Is someone accompanying this pt? no    Is the patient using any DME equipment during OV? no    Are there any discrepancies in patient's vital signs?  Pulse was really high, but p/t states this is normal.

## 2017-06-22 ENCOUNTER — Encounter

## 2017-06-23 MED ORDER — OMEPRAZOLE 20 MG CAP, DELAYED RELEASE
20 mg | ORAL_CAPSULE | ORAL | 0 refills | Status: DC
Start: 2017-06-23 — End: 2017-08-16

## 2017-06-23 MED ORDER — TRAZODONE 50 MG TAB
50 mg | ORAL_TABLET | ORAL | 0 refills | Status: DC
Start: 2017-06-23 — End: 2017-08-16

## 2017-06-23 NOTE — Telephone Encounter (Signed)
Refill request has been granted.  Needs to schedule a follow up appt prior to any other refills

## 2017-07-05 NOTE — Telephone Encounter (Signed)
Called and left message for pt to return a call to schedule an apt.

## 2017-08-03 ENCOUNTER — Encounter

## 2017-08-16 ENCOUNTER — Emergency Department: Admit: 2017-08-16 | Payer: Self-pay | Primary: Family

## 2017-08-16 ENCOUNTER — Inpatient Hospital Stay
Admit: 2017-08-16 | Discharge: 2017-08-17 | Disposition: A | Discharge status: Home / Self Care | Payer: Self-pay | Attending: Emergency Medicine

## 2017-08-16 DIAGNOSIS — S161XXA Strain of muscle, fascia and tendon at neck level, initial encounter: Secondary | ICD-10-CM

## 2017-08-16 LAB — POC URINE MICROSCOPIC

## 2017-08-16 LAB — POC URINE MACROSCOPIC
Bilirubin: NEGATIVE
Glucose: NEGATIVE mg/dl
Ketone: NEGATIVE mg/dl
Nitrites: NEGATIVE
Protein: 30 mg/dl — AB
Specific gravity: 1.01 (ref 1.005–1.030)
Urobilinogen: 0.2 EU/dl (ref 0.0–1.0)
pH (UA): 7.5 (ref 5–9)

## 2017-08-16 LAB — CBC WITH AUTOMATED DIFF
BASOPHILS: 0.5 % (ref 0–3)
EOSINOPHILS: 1.7 % (ref 0–5)
HCT: 34.4 % — ABNORMAL LOW (ref 37.0–50.0)
HGB: 11 gm/dl — ABNORMAL LOW (ref 13.0–17.2)
IMMATURE GRANULOCYTES: 0.2 % (ref 0.0–3.0)
LYMPHOCYTES: 31.4 % (ref 28–48)
MCH: 28.4 pg (ref 25.4–34.6)
MCHC: 32 gm/dl (ref 30.0–36.0)
MCV: 88.9 fL (ref 80.0–98.0)
MONOCYTES: 8 % (ref 1–13)
MPV: 10.2 fL — ABNORMAL HIGH (ref 6.0–10.0)
NEUTROPHILS: 58.2 % (ref 34–64)
NRBC: 0 (ref 0–0)
PLATELET: 292 10*3/uL (ref 140–450)
RBC: 3.87 M/uL (ref 3.60–5.20)
RDW-SD: 48.8 — ABNORMAL HIGH (ref 36.4–46.3)
WBC: 5.7 10*3/uL (ref 4.0–11.0)

## 2017-08-16 LAB — METABOLIC PANEL, BASIC
Anion gap: 6 mmol/L (ref 5–15)
BUN: 6 mg/dl — ABNORMAL LOW (ref 7–25)
CO2: 24 mEq/L (ref 21–32)
Calcium: 8.6 mg/dl (ref 8.5–10.1)
Chloride: 109 mEq/L — ABNORMAL HIGH (ref 98–107)
Creatinine: 0.9 mg/dl (ref 0.6–1.3)
GFR est AA: >60
GFR est non-AA: >60
Glucose: 78 mg/dl (ref 74–106)
Potassium: 4.2 mEq/L (ref 3.5–5.1)
Sodium: 140 mEq/L (ref 136–145)

## 2017-08-16 LAB — POC HCG,URINE: HCG urine, QL: NEGATIVE

## 2017-08-16 MED ORDER — SODIUM CHLORIDE 0.9 % IJ SYRG
Freq: Once | INTRAMUSCULAR | Status: DC
Start: 2017-08-16 — End: 2017-08-16

## 2017-08-16 MED ORDER — IBUPROFEN 600 MG TAB
600 mg | ORAL_TABLET | Freq: Four times a day (QID) | ORAL | 0 refills | Status: DC | PRN
Start: 2017-08-16 — End: 2017-09-30

## 2017-08-16 MED ORDER — METHOCARBAMOL 750 MG TAB
750 mg | ORAL_TABLET | Freq: Four times a day (QID) | ORAL | 0 refills | Status: DC
Start: 2017-08-16 — End: 2017-09-30
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
## 2017-09-30 ENCOUNTER — Emergency Department: Admit: 2017-09-30 | Payer: MEDICAID | Primary: Family

## 2017-09-30 ENCOUNTER — Inpatient Hospital Stay
Admit: 2017-09-30 | Discharge: 2017-09-30 | Disposition: A | Discharge status: Home / Self Care | Payer: MEDICAID | Attending: Emergency Medicine

## 2017-09-30 DIAGNOSIS — S2020XA Contusion of thorax, unspecified, initial encounter: Secondary | ICD-10-CM

## 2017-09-30 MED ORDER — ACETAMINOPHEN 325 MG TABLET
325 mg | ORAL | Status: AC
Start: 2017-09-30 — End: 2017-09-30
  Administered 2017-09-30: 20:00:00 via ORAL

## 2017-09-30 MED ORDER — CYCLOBENZAPRINE 10 MG TAB
10 mg | ORAL_TABLET | Freq: Three times a day (TID) | ORAL | 0 refills | Status: DC | PRN
Start: 2017-09-30 — End: 2017-09-30

## 2017-09-30 MED FILL — TYLENOL 325 MG TABLET: 325 mg | ORAL | Qty: 3

## 2017-09-30 NOTE — ED Notes (Signed)
Seen patient resting comfortably. Instructed to provide urine specimen
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
## 2017-09-30 NOTE — ED Notes (Signed)
Patient is in the hallway talking loudly unhappy on the disposition. Stating she doesn't have a bruise because she knows what bruise feels like; no Ct scan done, bruise will not show in xray. Verbalized leaving before providing urine specimen and not getting discharge paper. Dawn, PA informed.
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
## 2017-10-01 LAB — EKG, 12 LEAD, INITIAL
Atrial Rate: 106 {beats}/min
Calculated P Axis: 25 degrees
Calculated R Axis: -5 degrees
Calculated T Axis: 24 degrees
P-R Interval: 156 ms
Q-T Interval: 362 ms
QRS Duration: 96 ms
QTC Calculation (Bezet): 480 ms
Ventricular Rate: 106 {beats}/min
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
## 2017-12-26 ENCOUNTER — Encounter: Attending: Family | Primary: Family

## 2018-01-28 ENCOUNTER — Emergency Department: Admit: 2018-01-28 | Payer: MEDICAID | Primary: Family

## 2018-01-28 ENCOUNTER — Inpatient Hospital Stay
Admit: 2018-01-28 | Discharge: 2018-01-28 | Disposition: A | Discharge status: Home / Self Care | Payer: MEDICAID | Attending: Emergency Medicine

## 2018-01-28 DIAGNOSIS — O200 Threatened abortion: Secondary | ICD-10-CM

## 2018-01-28 LAB — POC URINE MACROSCOPIC
Bilirubin: NEGATIVE
Blood: NEGATIVE
Glucose: NEGATIVE mg/dl
Ketone: NEGATIVE mg/dl
Nitrites: NEGATIVE
Protein: NEGATIVE mg/dl
Specific gravity: 1.015 (ref 1.005–1.030)
Urobilinogen: 0.2 EU/dl (ref 0.0–1.0)
pH (UA): 7.5 (ref 5–9)

## 2018-01-28 LAB — BETA HCG, QT: HCG, beta, QT: 65964 m[IU]/mL

## 2018-01-28 LAB — CT/GC DNA
CHLAMYDIA TRACHOMATIS DNA: NOT DETECTED
NEISSERIA GONORRHOEAE DNA: NOT DETECTED

## 2018-01-28 LAB — WET PREP: Wet prep: NONE SEEN

## 2018-01-28 LAB — POC URINE MICROSCOPIC

## 2018-01-28 MED ORDER — AZITHROMYCIN 250 MG TAB
250 mg | ORAL | Status: AC
Start: 2018-01-28 — End: 2018-01-28
  Administered 2018-01-28: 23:00:00 via ORAL

## 2018-01-28 MED ORDER — LIDOCAINE (PF) 10 MG/ML (1 %) IJ SOLN
101 mg/mL (1 %) | Freq: Once | INTRAMUSCULAR | Status: AC
Start: 2018-01-28 — End: 2018-01-28
  Administered 2018-01-28: 23:00:00 via INTRAMUSCULAR

## 2018-01-28 MED FILL — CEFTRIAXONE 250 MG SOLUTION FOR INJECTION: 250 mg | INTRAMUSCULAR | Qty: 250

## 2018-01-28 MED FILL — AZITHROMYCIN 250 MG TAB: 250 mg | ORAL | Qty: 4

## 2018-01-28 NOTE — ED Provider Notes (Signed)
[facility] Care  Emergency Department Treatment Report    Patient: Lacey [name] Age: 27 y.o. Sex: female    Date of Birth: December 19, 1990 Admit Date: 01/28/2018 PCP: Jeralene Peters, NP   MRN: [id number]  CSN: [id number]  MD: Gwenyth Allegra, MD   Room: [id number] Time Dictated: 5:34 PM APP: [facility]       Chief Complaint   Pregnant, vaginal bleeding, abdominal cramping  History of Present Illness   27 y.o. female G3, P2 who states she is 7-1/[redacted] weeks pregnant however had an ultrasound on 9/19 which shows 10 weeks 1 day presenting with abdominal cramping.  She states today she had one clump of blood from the vagina.  She is concerned for miscarriage.  She states she had no other bleeding and currently has no bleeding.  She continues to have some abdominal cramping.  She denies vaginal discharge however she would like to be checked for STIs.  She states she recently had intercourse with a woman.  No vaginal pain, no fever, no vomiting, and no diarrhea.  Denies urinary symptoms.  OB is [facility], next appointment in 3 weeks    Review of Systems   Review of Systems   Constitutional: Negative for chills, diaphoresis and fever.   HENT: Negative for congestion and sore throat.    Eyes: Negative for blurred vision and double vision.   Respiratory: Negative for shortness of breath.    Cardiovascular: Negative for chest pain.   Gastrointestinal: Positive for abdominal pain (Cramping). Negative for nausea and vomiting.   Genitourinary: Negative for dysuria.        Pregnant, vaginal bleeding   Musculoskeletal: Negative for back pain.   Skin: Negative for rash.   Neurological: Negative for sensory change, focal weakness, loss of consciousness and headaches.   Psychiatric/Behavioral: Negative for substance abuse.      All other systems reviewed and are negative.  Past Medical/Surgical History     Past Medical History:   Diagnosis Date   ??? Anxiety    ??? Panic disorder      Past Surgical History:    Procedure Laterality Date   ??? HX GYN  2010    C Section       Social History     Social History     Socioeconomic History   ??? Marital status: LEGALLY SEPARATED     Spouse name: Not on file   ??? Number of children: Not on file   ??? Years of education: Not on file   ??? Highest education level: Not on file   Tobacco Use   ??? Smoking status: Current Every Day Smoker     Packs/day: 0.25   ??? Smokeless tobacco: Current User   Substance and Sexual Activity   ??? Alcohol use: No     Comment: Social   ??? Drug use: Yes     Types: Marijuana   ??? Sexual activity: Yes     Partners: Female       Family History     Family History   Problem Relation Age of Onset   ??? No Known Problems Mother        Current Medications     Current Facility-Administered Medications   Medication Dose Route Frequency Provider Last Rate Last Dose   ??? azithromycin (ZITHROMAX) tablet 1,000 mg  1,000 mg Oral NOW Callie Bunyard, [place], [place]       ??? cefTRIAXone (ROCEPHIN) 250 mg in lidocaine (PF) (XYLOCAINE) 10 mg/mL (  1 %) IM injection  250 mg IntraMUSCular ONCE [place], [place], [place]         Current Outpatient Medications   Medication Sig Dispense Refill   ??? amitriptyline (ELAVIL) 100 mg tablet Take 150 mg by mouth nightly.     ??? clonazePAM (KLONOPIN) 0.5 mg TbDi disintegrating tablet Take 1 Tab by mouth two (2) times daily as needed for Anxiety. Max Daily Amount: 1 mg. (Patient taking differently: Take 2 mg by mouth two (2) times daily as needed for Anxiety.) 60 Tab 0       Allergies   No Known Allergies    Physical Exam     Visit Vitals  BP 127/73 (BP 1 Location: Left arm, BP Patient Position: At rest)   Pulse (!) 104   Temp 98.3 ??F (36.8 ??C)   Resp 18   LMP 11/06/2017 (Approximate)   SpO2 100%     Physical Exam   Constitutional: She is oriented to person, place, and time and well-developed, well-nourished, and in no distress.   HENT:   Head: Normocephalic and atraumatic.   Mouth/Throat: Oropharynx is clear and moist.    Eyes: Pupils are equal, round, and reactive to light. Conjunctivae are normal.   Neck: Normal range of motion. Neck supple.   Cardiovascular: Normal rate, regular rhythm, normal heart sounds and intact distal pulses.   Pulmonary/Chest: Effort normal and breath sounds normal. She has no wheezes.   Abdominal: Soft. Bowel sounds are normal. There is no tenderness.   Soft and nontender   Genitourinary:   Genitourinary Comments: Normal external genitalia.  Cervical os is closed, she has no vaginal bleeding at this time.  No blood noted in the vault.  She does have copious amount of white to yellowish discharge in the vault.  She has no CMT or adnexal tenderness.  No erythema or lesions of the cervix   Musculoskeletal: Normal range of motion. She exhibits no edema or deformity.   Lymphadenopathy:     She has no cervical adenopathy.   Neurological: She is alert and oriented to person, place, and time. Gait normal. GCS score is 15.   Skin: Skin is warm and dry. No rash noted. She is not diaphoretic.   Psychiatric: Memory normal.   Vitals reviewed.      Impression and Management Plan   27 year old female who is pregnant presenting with vaginal bleeding and abdominal cramping  Obtain beta hCG, wet prep, GC/CT, transvaginal ultrasound    Diagnostic Studies   Lab:   Recent Results (from the past 12 hour(s))   WET PREP    Collection Time: 01/28/18  5:15 PM   Result Value Ref Range    Wet prep        No Yeast or motile Trichomonas seen  No Clue Cells Seen     POC URINE MACROSCOPIC    Collection Time: 01/28/18  5:23 PM   Result Value Ref Range    Glucose Negative NEGATIVE,Negative mg/dl    Bilirubin Negative NEGATIVE,Negative      Ketone Negative NEGATIVE,Negative mg/dl    Specific gravity 1.610 1.005 - 1.030      Blood Negative NEGATIVE,Negative      pH (UA) 7.5 5 - 9      Protein Negative NEGATIVE,Negative mg/dl    Urobilinogen 0.2 0.0 - 1.0 EU/dl    Nitrites Negative NEGATIVE,Negative       Leukocyte Esterase Trace (A) NEGATIVE,Negative      Color Yellow  Appearance Slightly Cloudy     POC URINE MICROSCOPIC    Collection Time: 01/28/18  5:23 PM   Result Value Ref Range    Epithelial cells, squamous 10-14 /LPF    WBC OCCASIONAL /HPF   BETA HCG, QT    Collection Time: 01/28/18  5:26 PM   Result Value Ref Range    HCG, beta, QT 65,964 mIU/ml       Imaging:    [place] Uts Transvaginal Ob    Result Date: 01/28/2018  Indication: Abdominal pain and pregnant Transvaginal images show uterus to be enlarged at 10.7 cm. A gestational sac is present measuring 8.8 cm at 9 weeks 2 days with fetal pole measuring 11 mm at 7 weeks 2 days. Fetal heart rate 152 bpm. Average age 1 weeks 2 days with [facility] 09/07/2018. Yolk sac visualized. Ovaries normal in size with 3.1 cm corpus luteum on the right. No significant free fluid.     IMPRESSION: Single live intrauterine gestation with calculated menstrual age 1 weeks 2 days.       Medical Decision Making/ED Course   Patient has trace leukocyte esterase.  I did discuss with patient that this could indicate STI which she is concerned for.  However the results of GC/CT will not be back today.  Patient does want to be treated.  We will treat with 1 g of Zithromax and 250 mg IM Rocephin.  I placed the ultrasound in the ED and found yolk sac, possible heart beat but may be too early to tell. Ordered formal transvaginal ultrasound    Ultrasound shows single live IUP at 8 weeks, 2 days.  Fetal heart rate detected at 152.  Discussed with patient we will have her follow-up with her OB/GYN in 1 week for recheck of her beta hCG.  She agrees to this plan.  She appears well, nontoxic. Patient remained clinically stable throughout the Emergency Department visit.  Vitals were unremarkable and the patient's condition required no further ED intervention.  The patient is stable for outpatient management with appropriate symptomatic treatment, return precautions, and was advised of the importance of  outpatient follow up for ongoing care      Medications   azithromycin (ZITHROMAX) tablet 1,000 mg (has no administration in time range)   cefTRIAXone (ROCEPHIN) 250 mg in lidocaine (PF) (XYLOCAINE) 10 mg/mL (1 %) IM injection (has no administration in time range)     Final Diagnosis       ICD-10-CM ICD-9-CM   1. Threatened miscarriage O20.0 640.00   2. Encounter for screening examination for sexually transmitted disease Z11.3 V74.5     Disposition   home        Patient was discharged home in stable condition with discharge instructions on the same.     Return to the ER if condition worsens or new symptoms develop.   Follow up with OBGYN as discussed.     The patient was personally evaluated by myself and discussed with NELSON, Lavone Neri, MD who agrees with the above assessment and plan.    Velia Meyer, PA-C  January 28, 2018      *Portions of this electronic record were dictated using Conservation officer, historic buildings.  Unintended errors in translation may occur.    My signature above authenticates this document and my orders, the final ??  diagnosis (es), discharge prescription (s), and instructions in the Epic ??  record.  If you have any questions please contact [phone].  ??  Nursing notes have  been reviewed by the physician/ advanced practice ??  Clinician.

## 2018-01-28 NOTE — ED Notes (Signed)
Pt given ACI , pt verbalized understanding

## 2018-01-28 NOTE — ED Triage Notes (Signed)
Pt came in from home with c/o vaginal bleeding , pt stated she paced a clump of blood approx 1 hour ago , pt denies vaginal bleeding at this time , does c/o mild cramping to right pelvic area.

## 2018-03-17 ENCOUNTER — Ambulatory Visit: Admit: 2018-03-18 | Payer: MEDICAID | Primary: Family

## 2018-03-17 DIAGNOSIS — O26892 Other specified pregnancy related conditions, second trimester: Secondary | ICD-10-CM

## 2018-03-17 NOTE — ED Provider Notes (Signed)
[facility]  Emergency Department Treatment Report    Patient: Lacey [name] Age: 27 y.o. Sex: female    Date of Birth: June 09, 1990 Admit Date: 03/17/2018 PCP: Danley Danker, NP   MRN: [id number]  CSN: [id number]     Room: OTF/OTF Time Dictated: 9:53 PM      Dragon medical dictation software was used for portions of this report.  Unintended transcription errors may occur.    Chief Complaint   Abdominal pain  History of Present Illness   27 y.o. female at [redacted] weeks gestation sent down to labor and delivery for abdominal pain.  She states that she was cooking dinner, developed low back pain, points to the midline low lumbar region.  Had no abdominal pain but then developed some epigastric pain described as midline, does not radiate right nor left.  She states that she has had a cough for about a week, it is productive of some yellowish sputum.  Has some associated congestion.  Denies any fever, denies any bloody sputum, has no chest pain, denies shortness of breath.  Call 911 today and was brought to the hospital, was sent to labor and delivery where she was cleared, has a closed cervical loss and no pelvic pain.  Was sent down for concerns for possible pneumonia versus cholecystitis apparently she had an outpatient ultrasound that showed gallstones.    Review of Systems   Review of Systems   Constitutional: Negative for chills, diaphoresis, fever and weight loss.   HENT: Negative for congestion and sore throat.    Respiratory: Positive for cough. Negative for sputum production, shortness of breath and wheezing.    Cardiovascular: Negative for chest pain, palpitations, orthopnea and leg swelling.   Gastrointestinal: Positive for abdominal pain. Negative for diarrhea, nausea and vomiting.   Genitourinary: Negative for dysuria, frequency and urgency.   Musculoskeletal: Positive for back pain. Negative for falls.   Skin: Negative for rash.   Neurological: Negative for dizziness and headaches.    Endo/Heme/Allergies: Does not bruise/bleed easily.   Psychiatric/Behavioral: Negative for substance abuse.       Past Medical/Surgical History     Past Medical History:   Diagnosis Date   ??? Anxiety    ??? Panic disorder      Past Surgical History:   Procedure Laterality Date   ??? HX CESAREAN SECTION     ??? HX GYN  2010    C Section       Social History     Social History     Socioeconomic History   ??? Marital status: LEGALLY SEPARATED     Spouse name: Not on file   ??? Number of children: Not on file   ??? Years of education: Not on file   ??? Highest education level: Not on file   Tobacco Use   ??? Smoking status: Current Every Day Smoker     Packs/day: 0.25   ??? Smokeless tobacco: Current User   Substance and Sexual Activity   ??? Alcohol use: No     Comment: Social   ??? Drug use: Yes     Types: Marijuana   ??? Sexual activity: Yes     Partners: Female   Other Topics Concern       Family History     Family History   Problem Relation Age of Onset   ??? No Known Problems Mother        Current Medications     Current Outpatient Medications  Medication Sig Dispense Refill   ??? amitriptyline (ELAVIL) 100 mg tablet Take 150 mg by mouth nightly.     ??? clonazePAM (KLONOPIN) 0.5 mg TbDi disintegrating tablet Take 1 Tab by mouth two (2) times daily as needed for Anxiety. Max Daily Amount: 1 mg. (Patient taking differently: Take 2 mg by mouth two (2) times daily as needed for Anxiety.) 60 Tab 0       Allergies   No Known Allergies    Physical Exam     Visit Vitals  BP 111/69   Pulse 96   Temp 98.1 ??F (36.7 ??C)   Resp 16   Ht 5' 4"  (1.626 m)   Wt 78.5 kg (173 lb)   LMP 11/06/2017 (Approximate)   SpO2 100%   BMI 29.70 kg/m??     Physical Exam   Constitutional: She is well-developed, well-nourished, and in no distress. No distress.   HENT:   Head: Normocephalic and atraumatic.   Eyes: Pupils are equal, round, and reactive to light. Conjunctivae are normal. Right eye exhibits no discharge. Left eye exhibits no discharge.    Neck: Normal range of motion. Neck supple. No tracheal deviation present.   Cardiovascular: Normal rate and normal heart sounds. Exam reveals no gallop and no friction rub.   No murmur heard.  Pulmonary/Chest: Effort normal and breath sounds normal. No stridor. No respiratory distress. She has no wheezes. She has no rales.   Abdominal: Soft. Bowel sounds are normal. She exhibits no distension and no mass. There is tenderness. There is no rebound and no guarding.   Mild to moderate epigastric pain right upper quadrant pain with a Percell Miller sign that is sometimes positive, when the patient is distracted has no pain whatsoever, for instance when I am doing the bedside ultrasound, I focused with the sonogram re-sign and she has no pain whatsoever.   Musculoskeletal: Normal range of motion. She exhibits no edema or deformity.   Reproducible midline low lumbar pain, no deformities, no step-offs, no overlying skin changes, no erythema.   Neurological: She is alert. GCS score is 15.   Skin: Skin is warm and dry. No rash noted. She is not diaphoretic. No erythema. No pallor.   Psychiatric: Affect normal.       Impression and Management Plan   Overall very well-appearing 27 year old female here today with reproducible lumbar back pain which I do believe to be benign muscular nature, epigastric pain which is equivocal, as stated she has pain when she is paying attention but when she is distracted has no pain whatsoever.  We will do a bedside ultrasound, check LFTs, lipase.  She does endorse a productive cough however she is afebrile do a chest x-ray.  Denies chest pain or dyspnea, do not suspect PE.    Diagnostic Studies   Lab:   Recent Results (from the past 12 hour(s))   CBC WITH AUTOMATED DIFF    Collection Time: 03/17/18 10:05 PM   Result Value Ref Range    WBC 6.2 4.0 - 11.0 1000/mm3    RBC 3.49 (L) 3.60 - 5.20 M/uL    HGB 10.7 (L) 13.0 - 17.2 gm/dl    HCT 32.0 (L) 37.0 - 50.0 %    MCV 91.7 80.0 - 98.0 fL     MCH 30.7 25.4 - 34.6 pg    MCHC 33.4 30.0 - 36.0 gm/dl    PLATELET 194 140 - 450 1000/mm3    MPV 11.0 (H) 6.0 - 10.0 fL  RDW-SD 48.3 (H) 36.4 - 46.3      NRBC 0 0 - 0      IMMATURE GRANULOCYTES 0.2 0.0 - 3.0 %    NEUTROPHILS 55.3 34 - 64 %    LYMPHOCYTES 35.5 28 - 48 %    MONOCYTES 6.0 1 - 13 %    EOSINOPHILS 2.7 0 - 5 %    BASOPHILS 0.3 0 - 3 %   METABOLIC PANEL, COMPREHENSIVE    Collection Time: 03/17/18 10:05 PM   Result Value Ref Range    Sodium 137 136 - 145 mEq/L    Potassium 3.7 3.5 - 5.1 mEq/L    Chloride 105 98 - 107 mEq/L    CO2 27 21 - 32 mEq/L    Glucose 73 (L) 74 - 106 mg/dl    BUN 9 7 - 25 mg/dl    Creatinine 0.7 0.6 - 1.3 mg/dl    GFR est AA >60.0      GFR est non-AA >60      Calcium 8.5 8.5 - 10.1 mg/dl    AST (SGOT) 20 15 - 37 U/L    ALT (SGPT) 15 12 - 78 U/L    Alk. phosphatase 45 45 - 117 U/L    Bilirubin, total 0.2 0.2 - 1.0 mg/dl    Protein, total 6.8 6.4 - 8.2 gm/dl    Albumin 2.9 (L) 3.4 - 5.0 gm/dl    Anion gap 5 5 - 15 mmol/L   LIPASE    Collection Time: 03/17/18 10:05 PM   Result Value Ref Range    Lipase 193 73 - 393 U/L       Imaging:    Xr Chest Pa Lat    Result Date: 03/17/2018  Indication: Cough PA and lateral chest  The heart is normal in size. Lungs are clear and the bony thorax is intact. No effusions or  other abnormalities are seen.     Impression: Normal study.       Bedside ultrasound: Using the transabdominal/curvilinear array probe the right upper quadrant was visualized revealing a gallbladder with a wall that is 2.5 mm, no pericholecystic fluid, negative sonographic Murphy sign, no evidence of cholelithiasis.  Result is a normal right upper quadrant ultrasound.    Patient then asked me to turn my attention towards the pelvis to "make sure the baby was okay", I did do a quick scan of the pelvis which revealed a single viable IUP with positive fetal heart tones and positive fetal movement.  Images were not saved for review as there was no medical  indication as she had no pain or lower abdominal/pelvic issues, and had OB evaluation prior to coming to the ER.    Medical Decision Making/ED Course   No leukocytosis, no left shift, negative right upper quadrant ultrasound negative chest x-ray, given her reassuring examination and work-up, no indication for hospitalization, no indication for antibiotics.  Will discharge to home with advised primary care and OB follow-up, advised acetaminophen as needed for pain.  She is advised return to the ER for any abrupt changes in pain with fever, chills, or any concerning issues.             Final Diagnosis       ICD-10-CM ICD-9-CM   1. Acute midline low back pain without sciatica M54.5 724.2   2. Abdominal pain, epigastric R10.13 789.06   3. Cough R05 786.2     Disposition   Discharge to home  Mora Bellman, MD  March 18, 2018    My signature above authenticates this document and my orders, the final ??  diagnosis (es), discharge prescription (s), and instructions in the Epic ??  record.  If you have any questions please contact [phone].  ??  Nursing notes have been reviewed by the physician/ advanced practice ??  Clinician.

## 2018-03-17 NOTE — Other (Signed)
Dr Marden NobleKemp paged and call returned. Report given on patient, states that he will be on unit to review patient

## 2018-03-17 NOTE — Other (Signed)
Was seen by Dr. Marden NobleKemp and [facility] and for transfer to ER     2050: call made to ER re transfer

## 2018-03-17 NOTE — ED Notes (Signed)
Fluids infusing as ordered; see MAR.

## 2018-03-17 NOTE — Other (Signed)
Patient presented to unit by EMT on stretcher, gave hx of abdominal pain to abdomen and lower back, is not associated with any PV loss. Was taken to room and placed in bed. Doppler fhr 162-173. Monitors applied to trace contraction

## 2018-03-17 NOTE — ED Notes (Signed)
[redacted] weeks pregnant. Transferred to ED from labor and delivery. Pt complains pain across upper abdomen. Possible pneumonia and severe lower back pain.

## 2018-03-17 NOTE — Other (Signed)
Patient was transferred to ER department in wheelchair

## 2018-03-17 NOTE — H&P (Signed)
History & Physical    Name: Lacey [name] MRN: [id number]  SSN: [id number]    Date of Birth: 18-Mar-1991  Age: 27 y.o.  Sex: female        [name] is a 27 y.o. G3 P36 female who is due 09/09/2018, by Other Basis. This makes her [redacted]w[redacted]d.  She  is being seen for   Chief Complaint   Patient presents with   ??? Abdominal Pain   ,  Admitting diagnosis:  [redacted]w[redacted]d/abd pain   Present on Admission:  **None**       Hospital Problem:Active Problems:    * No active hospital problems. *       Historical Additional  Problem List.:   Problem List as of 03/17/2018 Date Reviewed: December 11, 2016          Codes Class Noted - Resolved    Obesity, morbid (HCC) ICD-10-CM: E66.01  ICD-9-CM: 278.01  08/16/2016 - Present        Anxiety ICD-10-CM: F41.9  ICD-9-CM: 300.00  08/16/2016 - Present        Insomnia ICD-10-CM: G47.00  ICD-9-CM: 780.52  08/16/2016 - Present        History of syncope ICD-10-CM: Z87.898  ICD-9-CM: V15.89  08/16/2016 - Present        Abnormal weight gain ICD-10-CM: R63.5  ICD-9-CM: 783.1  08/16/2016 - Present               Her Obstetrical History:   OB History   Gravida Para Term Preterm AB Living   3 2   2   2    SAB TAB Ectopic Molar Multiple Live Births             2      # Outcome Date GA Lbr Len/2nd Weight Sex Delivery Anes PTL Lv   3 Current            2 Preterm 2014    M CS-LTranv  N LIV   1 Preterm 2010    F CS-LTranv  Y LIV   .       She is being seen in screening location 3113/3113.    No Known Allergies  Past Medical History:   Diagnosis Date   ??? Anxiety    ??? Panic disorder      Past Surgical History:   Procedure Laterality Date   ??? HX CESAREAN SECTION     ??? HX GYN  2010    C Section     Family History   Problem Relation Age of Onset   ??? No Known Problems Mother      Social History     Socioeconomic History   ??? Marital status: LEGALLY SEPARATED     Spouse name: Not on file   ??? Number of children: Not on file   ??? Years of education: Not on file   ??? Highest education level: Not on file   Occupational History   ??? Not on file    Social Needs   ??? Financial resource strain: Not on file   ??? Food insecurity:     Worry: Not on file     Inability: Not on file   ??? Transportation needs:     Medical: Not on file     Non-medical: Not on file   Tobacco Use   ??? Smoking status: Current Every Day Smoker     Packs/day: 0.25   ??? Smokeless tobacco: Current User   Substance and Sexual Activity   ??? Alcohol  use: No     Comment: Social   ??? Drug use: Yes     Types: Marijuana   ??? Sexual activity: Yes     Partners: Female   Lifestyle   ??? Physical activity:     Days per week: Not on file     Minutes per session: Not on file   ??? Stress: Not on file   Relationships   ??? Social connections:     Talks on phone: Not on file     Gets together: Not on file     Attends religious service: Not on file     Active member of club or organization: Not on file     Attends meetings of clubs or organizations: Not on file     Relationship status: Not on file   ??? Intimate partner violence:     Fear of current or ex partner: Not on file     Emotionally abused: Not on file     Physically abused: Not on file     Forced sexual activity: Not on file   Other Topics Concern   ??? Military Service Not Asked   ??? Blood Transfusions Not Asked   ??? Caffeine Concern Not Asked   ??? Occupational Exposure Not Asked   ??? Hobby Hazards Not Asked   ??? Sleep Concern Not Asked   ??? Stress Concern Not Asked   ??? Weight Concern Not Asked   ??? Special Diet Not Asked   ??? Back Care Not Asked   ??? Exercise Not Asked   ??? Bike Helmet Not Asked   ??? Seat Belt Not Asked   ??? Self-Exams Not Asked   Social History Narrative   ??? Not on file     Prior to Admission medications    Medication Sig Start Date End Date Taking? Authorizing Provider   amitriptyline (ELAVIL) 100 mg tablet Take 150 mg by mouth nightly.    Other, Phys, MD   clonazePAM (KLONOPIN) 0.5 mg TbDi disintegrating tablet Take 1 Tab by mouth two (2) times daily as needed for Anxiety. Max Daily Amount: 1 mg.   Patient taking differently: Take 2 mg by mouth two (2) times daily as needed for Anxiety. 12/08/16   Jeralene Peters, NP        Review of Systems  CONSTITUTIONAL: No Fever, No chills, No weight loss, No Night sweats  HEENT: No nasal discharge, No PN drainage, No epistaxis, No diff in swallowing  CVS: No chest pain, No palpitations, No syncope, No peripheral edema, No PND, No orthopnea  RS: No shortness of breath, No cough, No hemoptysis, No pleuritic chest pain  GI:  No vomitting, No diarrhea, No hematemesis, No rectal bleeding, No acid reflux or heartburn  NEURO: No focal weakness, No headaches, No seizures  PSYCH: No anxiety, No depression  MUSCULOSKLETAL: No joint pain or swelling  Lower back musculoskel tender  GU: No hematuria or dysuria  SKIN: No rash    Objective:  Visit Vitals  Ht 5\' 4"  (1.626 m)   Wt 78.5 kg (173 lb)   LMP 11/06/2017 (Approximate)   BMI 29.70 kg/m??       Fetal Heart Rate:   Fetal heart variability:moderate  Fetal Heart Rate decelerations: none  Fetal Heart Rate accelerations: yes  At least 10 beats elevation and two or more in twenty minutes  Baseline ZOX:WRUEAVW 120-160beats per minute  Fetal Non-stress Test: Reactive  Assessment of NST:   Good for admission/observation in L+D  with routine monitoring.     Baseline FHR: No data found.     Physical Exam:  Patient without distress coughing HEENT: normal  Neurologically: intact  Chest: clear to auscultation and percussion  Abdomen: benign and consistent with her dates  Tender  GB type  Pelvic:  Vaginal exam unchanged  Extremities: with some swelling but not hyperreflexia  CervicalExam: / / /     Prenatal Labs:   No results found for: RUBELLAEXT, GRBSEXT, HBSAGEXT, HIVEXT, RPREXT, GONNOEXT, CHLAMEXT    WBC   Date/Time Value Ref Range Status   08/16/2017 06:50 PM 5.7 4.0 - 11.0 1000/mm3 Final   09/11/2009 12:15 AM 6.2 4.6 - 13.2 K/uL Final   07/23/2009 07:40 PM 6.8 4.5 - 13.0 K/uL Final     HGB   Date/Time Value Ref Range Status    08/16/2017 06:50 PM 11.0 (L) 13.0 - 17.2 gm/dl Final   16/10/960405/13/2011 54:0912:15 AM 11.5 (L) 12.0 - 16.0 g/dL Final   81/19/147803/24/2011 29:5607:40 PM 10.9 (L) 12.0 - 16.0 g/dL Final     PLATELET   Date/Time Value Ref Range Status   08/16/2017 06:50 PM 292 140 - 450 1000/mm3 Final       Impression/Plan:     Impression: 14.6 wk gb type pain   Lower back muscular pain    Cough   URI?     Plan: cleared by ob  Not in labor or hypertensive dz     Ok for er ascessment    Signed By:  Milinda AntisPeter J Janari Gagner, MD     March 17, 2018   8:57 PM

## 2018-03-18 ENCOUNTER — Inpatient Hospital Stay
Admit: 2018-03-18 | Discharge: 2018-03-18 | Disposition: A | Discharge status: Home / Self Care | Payer: MEDICAID | Attending: Emergency Medicine

## 2018-03-18 LAB — CBC WITH AUTOMATED DIFF
BASOPHILS: 0.3 % (ref 0–3)
EOSINOPHILS: 2.7 % (ref 0–5)
HCT: 32 % — ABNORMAL LOW (ref 37.0–50.0)
HGB: 10.7 gm/dl — ABNORMAL LOW (ref 13.0–17.2)
IMMATURE GRANULOCYTES: 0.2 % (ref 0.0–3.0)
LYMPHOCYTES: 35.5 % (ref 28–48)
MCH: 30.7 pg (ref 25.4–34.6)
MCHC: 33.4 gm/dl (ref 30.0–36.0)
MCV: 91.7 fL (ref 80.0–98.0)
MONOCYTES: 6 % (ref 1–13)
MPV: 11 fL — ABNORMAL HIGH (ref 6.0–10.0)
NEUTROPHILS: 55.3 % (ref 34–64)
NRBC: 0 (ref 0–0)
PLATELET: 194 10*3/uL (ref 140–450)
RBC: 3.49 M/uL — ABNORMAL LOW (ref 3.60–5.20)
RDW-SD: 48.3 — ABNORMAL HIGH (ref 36.4–46.3)
WBC: 6.2 10*3/uL (ref 4.0–11.0)

## 2018-03-18 LAB — METABOLIC PANEL, COMPREHENSIVE
ALT (SGPT): 15 U/L (ref 12–78)
AST (SGOT): 20 U/L (ref 15–37)
Albumin: 2.9 gm/dl — ABNORMAL LOW (ref 3.4–5.0)
Alk. phosphatase: 45 U/L (ref 45–117)
Anion gap: 5 mmol/L (ref 5–15)
BUN: 9 mg/dl (ref 7–25)
Bilirubin, total: 0.2 mg/dl (ref 0.2–1.0)
CO2: 27 mEq/L (ref 21–32)
Calcium: 8.5 mg/dl (ref 8.5–10.1)
Chloride: 105 mEq/L (ref 98–107)
Creatinine: 0.7 mg/dl (ref 0.6–1.3)
GFR est AA: >60
GFR est non-AA: >60
Glucose: 73 mg/dl — ABNORMAL LOW (ref 74–106)
Potassium: 3.7 mEq/L (ref 3.5–5.1)
Protein, total: 6.8 gm/dl (ref 6.4–8.2)
Sodium: 137 mEq/L (ref 136–145)

## 2018-03-18 LAB — LIPASE: Lipase: 193 U/L (ref 73–393)

## 2018-03-18 MED ORDER — SODIUM CHLORIDE 0.9% BOLUS IV
0.9 % | INTRAVENOUS | Status: AC
Start: 2018-03-18 — End: 2018-03-18
  Administered 2018-03-18: 04:00:00 via INTRAVENOUS

## 2018-03-18 MED ORDER — SODIUM CHLORIDE 0.9 % IJ SYRG
Freq: Once | INTRAMUSCULAR | Status: AC
Start: 2018-03-18 — End: 2018-03-17
  Administered 2018-03-18: 04:00:00 via INTRAVENOUS

## 2018-03-18 NOTE — ED Notes (Signed)
12:36 AM  03/18/18     Discharge instructions given to Lacey [name] (name) with verbalization of understanding. Patient accompanied by family members.  Patient discharged with the following prescriptions none. Patient discharged to home (destination).      Stephanie Parker, RN

## 2018-04-04 ENCOUNTER — Inpatient Hospital Stay: Payer: MEDICAID

## 2018-04-04 NOTE — Other (Addendum)
1617: This G3P2 17.3 presents via wheelchair c/o contractions that started at 1300. Reports a small amount of LOF when they first started but nothing now, denies VB. Oriented to room, FHT 160s. Toco applied. Patient appears very distraught, states she is managed by both MFM and [facility] for history of eclampsia. Unable to locate prenatals in [facility] system. Dr. Arty BaumgartnerLaureta notified of patients arrival, MD cannot locate patient in her system. Orders to call Dr. Mayford KnifeWilliams. Dr. Mayford KnifeWilliams notified of patients arrival and complaints, history. Orders for SVE and discharge if closed. SVE closed, thick high and posterior. Informed patient that she has been cleared obstetrically but she is encouraged to seek care in the ER for pain. Patient declines.    1645: Pt discharged in ambulatory condition with written instructions and labor precautions provided. Pt given opportunity to ask questions and voice concerns and denies any at this time. [facility] changes in condition requiring further treatment. Patient encouraged to seek treatment for abdominal pain if she desires.
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
## 2018-04-15 NOTE — Other (Signed)
Dr Charlean MerlLindner was called. SBAR about pt complaints. Denies ROM, denies bleeding. BP read to Dr.  Dr stated pt can take tylenol, ice to head, and liquids for her HA. Pt needs to drink more water and stop the energy drinks.  Drinking water will help decrease the leg cramps. Pt to be discharged. Call your OB tomorrow and make an appointment.

## 2018-04-15 NOTE — Other (Addendum)
19 week G3P2 present by emergency squad. Taken to room 3114. Oriented to room. Assessment completed.Dopper done, Placed on ctx monitor.  Noted a NA lock in her left antecubital.

## 2018-04-16 ENCOUNTER — Inpatient Hospital Stay: Payer: MEDICAID

## 2018-11-18 ENCOUNTER — Emergency Department: Admit: 2018-11-19 | Payer: MEDICAID | Primary: Family

## 2018-11-18 DIAGNOSIS — M25531 Pain in right wrist: Secondary | ICD-10-CM

## 2018-11-18 NOTE — ED Triage Notes (Signed)
Missed a step and slipped to floor while carrying child in a car seat. Right wrist pain and bilateral ankle pain.

## 2018-11-18 NOTE — ED Provider Notes (Addendum)
[place]  Emergency Department Treatment Report    Patient: Lacey [name] Age: 28 y.o. Sex: female    Date of Birth: 09-Apr-1991 Admit Date: 11/18/2018 PCP: Danley Danker, NP   MRN: [id number]  CSN: [id number]  APC: Kriste Basque PA-C   Room: ER23/ER23 Time Dictated: 11:35 PM Attending MD: Lennart Pall, MD       Chief Complaint   Chief Complaint   Patient presents with   ??? Wrist Pain   ??? Ankle Injury        History of Present Illness   28 y.o. female presents status post fall from standing with right wrist pain and right ankle pain.  Patient explains that she was carrying her son's car seat was stepping down 1 stair outside that was wet because of rain and she slipped and fell onto her right arm outstretched and onto her right ankle.  She did not hit her head or lose consciousness.  She has been ambulatory without difficulty since the fall.  She is to have blurred or double vision, vomiting or extremity numbness or weakness.  She does not take blood thinning medications.  No other complaints at this time.    Review of Systems   Review of Systems   Constitutional: Negative for chills and fever.   HENT: Negative for congestion and sore throat.    Eyes: Negative for blurred vision and double vision.   Respiratory: Negative for cough and shortness of breath.    Cardiovascular: Negative for chest pain.   Gastrointestinal: Negative for abdominal pain, diarrhea, nausea and vomiting.   Genitourinary: Negative for dysuria.   Musculoskeletal: Positive for falls and joint pain.   Neurological: Negative for loss of consciousness and headaches.       Past Medical/Surgical History     Past Medical History:   Diagnosis Date   ??? Anxiety    ??? Panic disorder      Past Surgical History:   Procedure Laterality Date   ??? HX CESAREAN SECTION     ??? HX GYN  2010    C Section       Social History     Social History     Socioeconomic History   ??? Marital status: MARRIED     Spouse name: Not on file    ??? Number of children: Not on file   ??? Years of education: Not on file   ??? Highest education level: Not on file   Tobacco Use   ??? Smoking status: Current Every Day Smoker     Packs/day: 0.25   ??? Smokeless tobacco: Current User   Substance and Sexual Activity   ??? Alcohol use: No     Comment: Social   ??? Drug use: Yes     Types: Marijuana   ??? Sexual activity: Yes     Partners: Female   Other Topics Concern       Family History     Family History   Problem Relation Age of Onset   ??? No Known Problems Mother        Current Medications     Prior to Admission Medications   Prescriptions Last Dose Informant Patient Reported? Taking?   amitriptyline (ELAVIL) 100 mg tablet   Yes No   Sig: Take 150 mg by mouth nightly.   clonazePAM (KLONOPIN) 0.5 mg TbDi disintegrating tablet   No No   Sig: Take 1 Tab by mouth two (2) times daily as needed for  Anxiety. Max Daily Amount: 1 mg.   Patient taking differently: Take 2 mg by mouth two (2) times daily as needed for Anxiety.      Facility-Administered Medications: None       Allergies   No Known Allergies    Physical Exam     ED Triage Vitals   ED Encounter Vitals Group      BP 11/18/18 2253 (!) 145/94      Pulse (Heart Rate) 11/18/18 2253 97      Resp Rate 11/18/18 2253 18      Temp 11/18/18 2253 98.5 ??F (36.9 ??C)      Temp src --       O2 Sat (%) 11/18/18 2253 97 %      Weight 11/18/18 2250 200 lb      Height 11/18/18 2250 5\' 4"      Physical Exam  Vitals signs and nursing note reviewed.   HENT:      Head: Normocephalic and atraumatic.   Eyes:      General:         Right eye: No discharge.         Left eye: No discharge.   Neck:      Musculoskeletal: Normal range of motion and neck supple.   Cardiovascular:      Rate and Rhythm: Normal rate and regular rhythm.      Pulses:           Radial pulses are 2+ on the right side and 2+ on the left side.      Heart sounds: Normal heart sounds.   Pulmonary:      Effort: No tachypnea, accessory muscle usage or respiratory distress.       Breath sounds: No stridor. No decreased breath sounds, wheezing, rhonchi or rales.   Abdominal:      Palpations: Abdomen is soft.      Tenderness: There is no abdominal tenderness. There is no guarding or rebound.   Musculoskeletal: Normal range of motion.      Right shoulder: She exhibits no bony tenderness.      Left shoulder: She exhibits no bony tenderness.      Right elbow: No tenderness found.      Left elbow: No tenderness found.      Right wrist: She exhibits bony tenderness. She exhibits normal range of motion.      Left wrist: She exhibits no bony tenderness.      Right hip: She exhibits no bony tenderness.      Left hip: She exhibits no bony tenderness.      Right knee: No tenderness found.      Left knee: No tenderness found.      Right ankle: Tenderness. Lateral malleolus tenderness found.      Left ankle: No tenderness.      Cervical back: She exhibits no bony tenderness.      Thoracic back: She exhibits no bony tenderness.      Lumbar back: She exhibits no bony tenderness.      Right foot: Bony tenderness present.      Left foot: No bony tenderness.   Skin:     General: Skin is warm and dry.      Findings: No rash.   Neurological:      Mental Status: She is alert and oriented to person, place, and time.   Psychiatric:         Mood and Affect: Affect normal.  Impression and Management Plan   Ddx: Sprain, strain, fracture, dislocation    Plan:   Orders Placed This Encounter   ??? XR WRIST RT AP/LAT   ??? XR FOOT RT MIN 3 V   ??? XR ANKLE RT MIN 3 V   ??? acetaminophen (TYLENOL) 325 mg tablet          Diagnostic Studies   Lab:   No results found for this or any previous visit (from the past 12 hour(s)).    Imaging:    No results found.  RT foot, RT ankle and RT wrist: no acute abnomality per myself and Dr.Keimari Raleigh, pending radiology interpretation.     ED Course     No data found.  Medications - No data to display     Medical Decision Making      28 yo female presents status post fall from standing with right wrist pain and right ankle pain.  Patient explains that she was carrying her son's car seat was stepping down 1 stair outside that was wet because of rain and she slipped and fell onto her right arm outstretched and onto her right ankle.  She did not hit her head or lose consciousness.  She has been ambulatory without difficulty since the fall.  She is to have blurred or double vision, vomiting or extremity numbness or weakness.  She does not take blood thinning medications.  No other complaints at this time.  Physical exam as above.  Given bony tenderness of right wrist, right ankle and right foot, plain images were obtained which were negative per myself Dr. Romeo AppleBen A Keeara Frees's read.  Pending formal radiology interpretation.  Patient was placed in volar splint for comfort with strict return precautions and outpatient follow-up instructions.  I feel the patient is stable for discharge at this time to continue outpatient management and follow up. Patient was instructed to return to the ER if condition worsens or new symptoms develop. The patient indicates understanding of these issues and agrees with the plan.       Final Diagnosis       ICD-10-CM ICD-9-CM   1. Fall, initial encounter  W19.XXXA E888.9   2. Right wrist pain  M25.531 719.43   3. Acute right ankle pain  M25.571 719.47     338.19   4. Right wrist sprain, initial encounter  S63.501A 842.00   5. Moderate right ankle sprain, initial encounter  S93.401A 845.00       Disposition   [facility] home in stable condition   Discharge Medication List as of 11/19/2018 12:01 AM      START taking these medications    Details   acetaminophen (TYLENOL) 325 mg tablet Take 2 Tabs by mouth every four (4) hours as needed for Pain., Print, Disp-20 Tab,R-0         CONTINUE these medications which have NOT CHANGED    Details   amitriptyline (ELAVIL) 100 mg tablet Take 150 mg by mouth nightly., Historical Med       clonazePAM (KLONOPIN) 0.5 mg TbDi disintegrating tablet Take 1 Tab by mouth two (2) times daily as needed for Anxiety. Max Daily Amount: 1 mg., Print, Disp-60 Tab, R-0             Evette GeorgesSarah Creekmore PA-C  November 27, 2018  11:35 PM      The patient was personally evaluated by myself. Discussed with and or seen by Dr. Carmela HurtFICKENSCHER, Carlis StableBEN A, MD who agrees with the above assessment  and plan.    *Portions of this electronic record were dictated using Dragon voice recognition software.  Unintended errors in translation may occur.    My signature above authenticates this document and my orders, the final    diagnosis (es), discharge prescription (s), and instructions in the Epic    record.  If you have any questions please contact [phone].     Nursing notes have been reviewed by the physician/ advanced practice    Clinician.

## 2018-11-19 ENCOUNTER — Inpatient Hospital Stay
Admit: 2018-11-19 | Discharge: 2018-11-19 | Disposition: A | Discharge status: Home / Self Care | Payer: MEDICAID | Attending: Emergency Medicine

## 2018-11-19 MED ORDER — ACETAMINOPHEN 325 MG TABLET
325 mg | ORAL_TABLET | ORAL | 0 refills | Status: DC | PRN
Start: 2018-11-19 — End: 2020-03-17

## 2019-01-08 ENCOUNTER — Encounter: Attending: Family | Primary: Family

## 2019-02-03 ENCOUNTER — Inpatient Hospital Stay
Admit: 2019-02-03 | Discharge: 2019-02-03 | Disposition: A | Discharge status: Home / Self Care | Payer: MEDICAID | Attending: Emergency Medicine

## 2019-02-03 ENCOUNTER — Emergency Department: Admit: 2019-02-03 | Payer: MEDICAID | Primary: Family

## 2019-02-03 DIAGNOSIS — R079 Chest pain, unspecified: Secondary | ICD-10-CM

## 2019-02-03 LAB — CBC WITH AUTOMATED DIFF
BASOPHILS: 0.4 % (ref 0–3)
EOSINOPHILS: 1 % (ref 0–5)
HCT: 39.2 % (ref 37.0–50.0)
HGB: 12.8 gm/dl — ABNORMAL LOW (ref 13.0–17.2)
IMMATURE GRANULOCYTES: 0.2 % (ref 0.0–3.0)
LYMPHOCYTES: 24.1 % — ABNORMAL LOW (ref 28–48)
MCH: 30.1 pg (ref 25.4–34.6)
MCHC: 32.7 gm/dl (ref 30.0–36.0)
MCV: 92.2 fL (ref 80.0–98.0)
MONOCYTES: 6.2 % (ref 1–13)
MPV: 10.8 fL — ABNORMAL HIGH (ref 6.0–10.0)
NEUTROPHILS: 68.1 % — ABNORMAL HIGH (ref 34–64)
NRBC: 0 (ref 0–0)
PLATELET: 287 10*3/uL (ref 140–450)
RBC: 4.25 M/uL (ref 3.60–5.20)
RDW-SD: 56.3 — ABNORMAL HIGH (ref 36.4–46.3)
WBC: 9.6 10*3/uL (ref 4.0–11.0)

## 2019-02-03 LAB — EKG, 12 LEAD, INITIAL
Atrial Rate: 116 {beats}/min
Calculated P Axis: 63 degrees
Calculated R Axis: 76 degrees
Calculated T Axis: 53 degrees
P-R Interval: 152 ms
Q-T Interval: 326 ms
QRS Duration: 88 ms
QTC Calculation (Bezet): 453 ms
Ventricular Rate: 116 {beats}/min

## 2019-02-03 LAB — METABOLIC PANEL, BASIC
Anion gap: 6 mmol/L (ref 5–15)
BUN: 14 mg/dl (ref 7–25)
CO2: 30 mEq/L (ref 21–32)
Calcium: 9.2 mg/dl (ref 8.5–10.1)
Chloride: 101 mEq/L (ref 98–107)
Creatinine: 1.2 mg/dl (ref 0.6–1.3)
GFR est AA: >60
GFR est non-AA: 56
Glucose: 98 mg/dl (ref 74–106)
Potassium: 3.6 mEq/L (ref 3.5–5.1)
Sodium: 137 mEq/L (ref 136–145)

## 2019-02-03 LAB — TROPONIN I: Troponin-I: <0.015 ng/ml (ref 0.000–0.045)

## 2019-02-03 MED ORDER — LORAZEPAM 1 MG TAB
1 mg | ORAL | Status: DC
Start: 2019-02-03 — End: 2019-02-03

## 2019-02-03 MED ORDER — HYDROXYZINE 50 MG TAB
50 mg | ORAL_TABLET | Freq: Four times a day (QID) | ORAL | 0 refills | Status: DC | PRN
Start: 2019-02-03 — End: 2019-06-19

## 2019-02-03 MED FILL — LORAZEPAM 1 MG TAB: 1 mg | ORAL | Qty: 1

## 2019-02-03 NOTE — ED Triage Notes (Signed)
Pt arrived to the ER with c/o chest pain that started an hour ago.  Pt states that the chest pain is a crushing pain, that is in the middle of her chest that dose not radiate currently.  Pt received 324 ASA and 1 sublingual NTG. She stated the pain subsided but it is back.

## 2019-02-03 NOTE — ED Notes (Signed)
4:09 AM  02/03/19     Discharge instructions given to patient (name) with verbalization of understanding. Patient accompanied by self.  Patient discharged with the following prescriptions ATARAX. Patient discharged to home (destination).      Ashton Anderson Milanes
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
## 2019-03-10 ENCOUNTER — Emergency Department: Admit: 2019-03-11 | Payer: MEDICAID | Primary: Family

## 2019-03-10 DIAGNOSIS — R079 Chest pain, unspecified: Secondary | ICD-10-CM

## 2019-03-10 NOTE — ED Notes (Signed)
The LC made an attempt to contact the patient by phone (left voicemail message) to offer assistance with the consult ordered. The LC mailed the patient the [facility] - Who we are and what we do packet, the GoodRx medication discount card, the [facility] list, [facility] list, Dr. Akragorn contact information and the [facility] informational letter.      The patient was seen in the ER Department on 03/10/2019 for chest pain, unspecified type, syncope and collapse      The consult ordered by McCormack, Sara N, MD was for - Needs pcp f/u, recheck for chest pain      The patient is listed as having insurance coverage through [employer]      The patient is listed as non chronic
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
## 2019-03-10 NOTE — ED Notes (Signed)
Patient complains of chest pain started at 1800 this evening. Patient's pain was 7/10 and radiated to the back between the shoulder blades and up neck. Patient was doing dishes at the time of the onset of the chest pain. Patient has a history of anxiety, and has a lot of family stressors going on right now: moving house and looking after three kids.   BP was 150/100, heart rate 130 on scene and received 324mg of aspirin, and also received 1 sublingual nitroglycerin. Pain went from 7/10 to 1/10. Patient does not see a cardiologist.

## 2019-03-10 NOTE — ED Provider Notes (Signed)
[facility]  Emergency Department Treatment Report        Patient: Lacey [name] Age: 28 y.o. Sex: female    Date of Birth: [birth year] Admit Date: 03/10/2019 PCP: Danley Danker, NP   MRN: [id number]  CSN: [id number]  Attending: Tawanna Cooler, MD   Room: [phone] Time Dictated: 10:15 PM APP: Chrystine Oiler       Chief Complaint   Chest pain, passed out    History of Present Illness     This is a 28 y.o. female who this evening was just going about her daily chores, began to feel substernal chest tightness radiating to her jaw on the right side, left shoulder, started to feel short of breath, is not sure if she started breathing fast but started to get tingling and numbness in her hands and feet, felt presyncopal, laid down, and passed out for what she suspects were few minutes.  She awakened, still experiencing this discomfort.  Concerned friend called EMS when she reluctantly came to the hospital for further evaluation.    She states chest tightness is nearly resolved but not completely, no longer radiating, no longer associated with any dyspnea, was initially 8/10 in severity but has improved.  She states the chest discomfort is similar to what she experienced in October when she presented with similar complaint but no syncope.  She does not feel this is related to her history of anxiety.     Patient has been under great deal more stress recently, currently trying to move, caring for her 6 children, getting less sleep, working 14-hour days, and tearfully relates to me in confidence that she has a history of bulimia as well and has no psychologic resources for this as nobody is accepting her Medicaid.    No history of DVT, PE, recent hormone use, recent travel, recent surgeries, recent splinting, recent fractures or hemoptysis.    Review of Systems       Constitutional: No fever, chills, or weight loss  Eyes: No visual symptoms.  ENT: No sore throat, congestion or ear pain.   Respiratory: Dyspnea, resolved, no cough.  Cardiovascular: As above.  Gastrointestinal: No vomiting, diarrhea or abdominal pain.  Genitourinary: No painful urination, frequency, or urgency.  Musculoskeletal: No joint pain or swelling.  Integumentary: No rashes.  Neurological: No headaches or motor symptoms.  Psych: No suicidal or homicidal ideation.      Past Medical/Surgical History     Past Medical History:   Diagnosis Date   ??? Anxiety    ??? Panic disorder      Past Surgical History:   Procedure Laterality Date   ??? HX CESAREAN SECTION     ??? HX GYN  2010    C Section       Social History     Social History     Socioeconomic History   ??? Marital status: MARRIED     Spouse name: Not on file   ??? Number of children: Not on file   ??? Years of education: Not on file   ??? Highest education level: Not on file   Occupational History   ??? Not on file   Social Needs   ??? Financial resource strain: Not on file   ??? Food insecurity     Worry: Not on file     Inability: Not on file   ??? Transportation needs     Medical: Not on file     Non-medical: Not on file  Tobacco Use   ??? Smoking status: Current Every Day Smoker     Packs/day: 0.25   ??? Smokeless tobacco: Current User   Substance and Sexual Activity   ??? Alcohol use: No     Comment: Social   ??? Drug use: Yes     Types: Marijuana   ??? Sexual activity: Yes     Partners: Female   Lifestyle   ??? Physical activity     Days per week: Not on file     Minutes per session: Not on file   ??? Stress: Not on file   Relationships   ??? Social Wellsite geologistconnections     Talks on phone: Not on file     Gets together: Not on file     Attends religious service: Not on file     Active member of club or organization: Not on file     Attends meetings of clubs or organizations: Not on file     Relationship status: Not on file   ??? Intimate partner violence     Fear of current or ex partner: Not on file     Emotionally abused: Not on file     Physically abused: Not on file     Forced sexual activity: Not on file    Other Topics Concern   ??? Military Service Not Asked   ??? Blood Transfusions Not Asked   ??? Caffeine Concern Not Asked   ??? Occupational Exposure Not Asked   ??? Hobby Hazards Not Asked   ??? Sleep Concern Not Asked   ??? Stress Concern Not Asked   ??? Weight Concern Not Asked   ??? Special Diet Not Asked   ??? Back Care Not Asked   ??? Exercise Not Asked   ??? Bike Helmet Not Asked   ??? Seat Belt Not Asked   ??? Self-Exams Not Asked   Social History Narrative   ??? Not on file       Family History     Family History   Problem Relation Age of Onset   ??? No Known Problems Mother        Current Medications     Prior to Admission Medications   Prescriptions Last Dose Informant Patient Reported? Taking?   acetaminophen (TYLENOL) 325 mg tablet   No No   Sig: Take 2 Tabs by mouth every four (4) hours as needed for Pain.   amitriptyline (ELAVIL) 100 mg tablet   Yes Yes   Sig: Take 150 mg by mouth nightly.   clonazePAM (KLONOPIN) 0.5 mg TbDi disintegrating tablet   No No   Sig: Take 1 Tab by mouth two (2) times daily as needed for Anxiety. Max Daily Amount: 1 mg.   Patient taking differently: Take 2 mg by mouth two (2) times daily as needed for Anxiety.   hydrOXYzine HCL (ATARAX) 50 mg tablet   No No   Sig: Take 1 Tab by mouth every six (6) hours as needed for Anxiety.      Facility-Administered Medications: None       Allergies   No Known Allergies    Physical Exam     ED Triage Vitals   ED Encounter Vitals Group      BP 03/10/19 1946 (!) 149/85      Pulse (Heart Rate) 03/10/19 1946 (!) 108      Resp Rate 03/10/19 1946 22      Temp 03/10/19 2014 98.3 ??F (36.8 ??C)  Temp src --       O2 Sat (%) 03/10/19 1946 100 %      Weight 03/10/19 1936 179 lb      Height 03/10/19 1936 5\' 4"      Constitutional: Patient appears well developed and well nourished. Appearance and behavior are age and situation appropriate.  HEENT: Conjunctiva clear.  PERRLA. Mucous membranes moist, non-erythematous. Surface of the pharynx, palate, and tongue are pink,  moist and without lesions.  Neck: supple, non tender, symmetrical, no masses or meningismus.   Respiratory: lungs clear to auscultation, nonlabored respirations. No tachypnea or accessory muscle use.  Cardiovascular: Heart slightly tachycardic, regular rhythm, no murmurs rubs or gallops.  Gastrointestinal:  Abdomen soft, nontender without complaint of pain to palpation   Musculoskeletal: Nail beds pink with prompt capillary refill  Integumentary: warm and dry without rashes or lesions  Neurologic: alert and oriented, Sensation intact, motor strength equal and symmetric.  No facial asymmetry or dysarthria.  Psych: Judgment appears appropriate. Recent and remote memory appear to be intact. Oriented to time, place, and person. Mood and affect appropriate.         Impression and Management Plan     Patient with chest pain.  Acute ischemic coronary disease must be considered first, and with the patient protected against the consequences of the same, while other etiologies (including infectious, pulmonary, GI, and MSK considered).  We will order appropriate labs and tests to evaluate and rule out immediate threats to life and emergent causes of symptoms, and disposition based on the results of diagnostic testing and the response to symptomatic treatment, with considerations of the patient's concerns and desires for ongoing and follow up care.    Even with history of bulimia, low concern for Boerhaave's at this time given reassuring examination and improving symptoms, will obtain screening chest x-ray.    We will obtain screening D-dimer for PE given tachycardia, although this was present with the similar symptoms she had 1 month ago and given intermittent nature of discomfort along with resolved dyspnea today I have a lower suspicion for this.      This patient was evaluated in the emergency department for symptoms described in the history of present illness. They were evaluated in the  context of global COVID-19 pandemic, which necessitated consideration of the patient may be at  risk for infection with the SARS-COV-2 virus that causes COVID-19. Institutional protocols and algorithms that pertain to the evaluation of patients at risk for COVID-19 are in a state of rapid change based on the information released by regulatory bodies including the CDC and federal state organizations.  These policies and algorithms were followed during the patient's care in the emergency department.          Procedures      Diagnostic Studies     LAB/IMAGING:   Results for orders placed or performed during the hospital encounter of 03/10/19   XR CHEST SNGL V    Narrative    EXAM:  Chest AP    INDICATIONS: Chest Pain     COMPARISON: Most recently 03/17/2018.    FINDINGS:     No consolidation, pleural effusion, or pulmonary edema. No pneumothorax.    Normal cardiomediastinal contours.      Impression    IMPRESSION:   No acute cardiopulmonary process.     CBC WITH AUTOMATED DIFF   Result Value Ref Range    WBC 7.4 4.0 - 11.0 1000/mm3    RBC 4.01 3.60 -  5.20 M/uL    HGB 12.1 (L) 13.0 - 17.2 gm/dl    HCT 16.1 09.6 - 04.5 %    MCV 92.8 80.0 - 98.0 fL    MCH 30.2 25.4 - 34.6 pg    MCHC 32.5 30.0 - 36.0 gm/dl    PLATELET 409 811 - 914 1000/mm3    MPV 11.1 (H) 6.0 - 10.0 fL    RDW-SD 51.2 (H) 36.4 - 46.3      NRBC 0 0 - 0      IMMATURE GRANULOCYTES 0.1 0.0 - 3.0 %    NEUTROPHILS 69.0 (H) 34 - 64 %    LYMPHOCYTES 20.2 (L) 28 - 48 %    MONOCYTES 8.4 1 - 13 %    EOSINOPHILS 2.0 0 - 5 %    BASOPHILS 0.3 0 - 3 %   METABOLIC PANEL, BASIC   Result Value Ref Range    Sodium 140 136 - 145 mEq/L    Potassium 4.6 3.5 - 5.1 mEq/L    Chloride 106 98 - 107 mEq/L    CO2 26 21 - 32 mEq/L    Glucose 43 (LL) 74 - 106 mg/dl    BUN 18 7 - 25 mg/dl    Creatinine 1.0 0.6 - 1.3 mg/dl    GFR est AA >78.2      GFR est non-AA >60      Calcium 10.0 8.5 - 10.1 mg/dl    Anion gap 8 5 - 15 mmol/L   TROPONIN I   Result Value Ref Range     Troponin-I <0.015 0.000 - 0.045 ng/ml   D DIMER   Result Value Ref Range    D DIMER 0.42 0.01 - 0.50 ug/mL (FEU)   GLUCOSE, POC   Result Value Ref Range    Glucose (POC) 88 65 - 105 mg/dL   GLUCOSE, POC   Result Value Ref Range    Glucose (POC) 95 65 - 105 mg/dL       EKG  Dr. Sherian Maroon did not see any acute S-T segment or T-wave abnormalities that are consistent with acute ischemia or infarction.  Normal sinus rhythm, ventricular rate 100, PR interval 146 QRS 84 QTc 436.    Medical Decision Making/ ED Course       Patient somewhat hyperglycemic, given crackers and is improved.  She feels much better now.   Heart score risk less than 3, low risk for 30-day morbidity or mortality.  Offered 3-hour troponin for further stratification but patient would prefer to get back to her family.  She feels much better now, no chest discomfort, feels more alert after eating.  Patient had a very long discussion with our crisis clinician regarding her very complex social care, lack of support network, and they have given her as many resources as they could for housing, support systems, and psychologic follow-up.  We will flag chart for life coach follow-up as well for reevaluation medically.  Patient is very grateful, no follow-up on an outpatient basis and understands she can return any time for further evaluation for any new or worsening symptoms or concerns.    Meds given:  Medications   aluminum & magnesium hydroxide-simethicone (MYLANTA II) oral suspension 30 mL (30 mL Oral Given 03/10/19 2114)   sucralfate (CARAFATE) 100 mg/mL oral suspension 1 g (1 g Oral Given 03/10/19 2115)   lidocaine (XYLOCAINE) 2 % viscous solution 10 mL (10 mL Mouth/Throat Given 03/10/19 2114)   sodium chloride 0.9 % bolus  infusion 1,000 mL (1,000 mL IntraVENous New Bag 03/10/19 2050)       Final Diagnosis         ICD-10-CM ICD-9-CM   1. Chest pain, unspecified type  R07.9 786.50   2. Syncope and collapse  R55 780.2       Disposition      Disposition and plan  Patient was discharged home in stable condition with discharge instructions on the same.  Prescription for MiraLAX.    Return to the ER if condition worsens or new symptoms develop.   Follow up with primary care as discussed.     The patient was personally evaluated by myself and discussed with Despina Hick, MD who agrees with the above assessment and plan.    Dragon medical dictation software was used for portions of this report. Unintended errors may occur.     Anibal Henderson, MPA, PA-C  March 10, 2019    My signature above authenticates this document and my orders, the final    diagnosis (es), discharge prescription (s), and instructions in the Epic    record.  If you have any questions please contact [phone].     Nursing notes have been reviewed by the physician/ advanced practice    Clinician.

## 2019-03-10 NOTE — ED Notes (Signed)
11:00 PM  03/10/19     Discharge instructions given to patient (name) with verbalization of understanding. Patient accompanied by self.  Patient discharged with the following prescriptions mylanta. Patient discharged to home (destination).      Lacey [name]

## 2019-03-11 ENCOUNTER — Inpatient Hospital Stay
Admit: 2019-03-11 | Discharge: 2019-03-11 | Disposition: A | Discharge status: Home / Self Care | Payer: MEDICAID | Attending: Emergency Medicine

## 2019-03-11 LAB — EKG, 12 LEAD, INITIAL
Atrial Rate: 100 {beats}/min
Calculated P Axis: 52 degrees
Calculated R Axis: 65 degrees
Calculated T Axis: 54 degrees
Diagnosis: NORMAL
P-R Interval: 146 ms
Q-T Interval: 338 ms
QRS Duration: 84 ms
QTC Calculation (Bezet): 436 ms
Ventricular Rate: 100 {beats}/min

## 2019-03-11 LAB — CBC WITH AUTOMATED DIFF
BASOPHILS: 0.3 % (ref 0–3)
EOSINOPHILS: 2 % (ref 0–5)
HCT: 37.2 % (ref 37.0–50.0)
HGB: 12.1 gm/dl — ABNORMAL LOW (ref 13.0–17.2)
IMMATURE GRANULOCYTES: 0.1 % (ref 0.0–3.0)
LYMPHOCYTES: 20.2 % — ABNORMAL LOW (ref 28–48)
MCH: 30.2 pg (ref 25.4–34.6)
MCHC: 32.5 gm/dl (ref 30.0–36.0)
MCV: 92.8 fL (ref 80.0–98.0)
MONOCYTES: 8.4 % (ref 1–13)
MPV: 11.1 fL — ABNORMAL HIGH (ref 6.0–10.0)
NEUTROPHILS: 69 % — ABNORMAL HIGH (ref 34–64)
NRBC: 0 (ref 0–0)
PLATELET: 254 10*3/uL (ref 140–450)
RBC: 4.01 M/uL (ref 3.60–5.20)
RDW-SD: 51.2 — ABNORMAL HIGH (ref 36.4–46.3)
WBC: 7.4 10*3/uL (ref 4.0–11.0)

## 2019-03-11 LAB — METABOLIC PANEL, BASIC
Anion gap: 8 mmol/L (ref 5–15)
BUN: 18 mg/dl (ref 7–25)
CO2: 26 mEq/L (ref 21–32)
Calcium: 10 mg/dl (ref 8.5–10.1)
Chloride: 106 mEq/L (ref 98–107)
Creatinine: 1 mg/dl (ref 0.6–1.3)
GFR est AA: >60
GFR est non-AA: >60
Glucose: 43 mg/dl — CL (ref 74–106)
Potassium: 4.6 mEq/L (ref 3.5–5.1)
Sodium: 140 mEq/L (ref 136–145)

## 2019-03-11 LAB — TROPONIN I: Troponin-I: <0.015 ng/ml (ref 0.000–0.045)

## 2019-03-11 LAB — GLUCOSE, POC
Glucose (POC): 88 mg/dL (ref 65–105)
Glucose (POC): 95 mg/dL (ref 65–105)

## 2019-03-11 LAB — D DIMER: D DIMER: 0.42 ug/mL (FEU) (ref 0.01–0.50)

## 2019-03-11 MED ORDER — ALUM-MAG HYDROXIDE-SIMETH 400 MG-400 MG-40 MG/5 ML ORAL SUSP
400-400-40 mg/5 mL | Freq: Four times a day (QID) | ORAL | 0 refills | Status: DC | PRN
Start: 2019-03-11 — End: 2019-11-06

## 2019-03-11 MED ORDER — LIDOCAINE 2 % MUCOSAL SOLN
2 % | Status: AC
Start: 2019-03-11 — End: 2019-03-10
  Administered 2019-03-11: 02:00:00 via OROMUCOSAL

## 2019-03-11 MED ORDER — SUCRALFATE 100 MG/ML ORAL SUSP
100 mg/mL | ORAL | Status: AC
Start: 2019-03-11 — End: 2019-03-10
  Administered 2019-03-11: 02:00:00 via ORAL

## 2019-03-11 MED ORDER — SODIUM CHLORIDE 0.9% BOLUS IV
0.9 % | INTRAVENOUS | Status: AC
Start: 2019-03-11 — End: 2019-03-10
  Administered 2019-03-11: 02:00:00 via INTRAVENOUS

## 2019-03-11 MED ORDER — ALUM-MAG HYDROXIDE-SIMETH 400 MG-400 MG-40 MG/5 ML ORAL SUSP
400-400-40 mg/5 mL | ORAL | Status: AC
Start: 2019-03-11 — End: 2019-03-10
  Administered 2019-03-11: 02:00:00 via ORAL

## 2019-03-11 MED FILL — MAG-AL PLUS EXTRA STRENGTH 400 MG-400 MG-40 MG/5 ML ORAL SUSPENSION: 400-400-40 mg/5 mL | ORAL | Qty: 30

## 2019-03-11 MED FILL — SUCRALFATE 100 MG/ML ORAL SUSP: 100 mg/mL | ORAL | Qty: 10

## 2019-03-11 MED FILL — LIDOCAINE VISCOUS 2 % MUCOSAL SOLUTION: 2 % | Qty: 15

## 2019-06-19 ENCOUNTER — Telehealth: Admit: 2019-06-19 | Payer: MEDICAID | Attending: Family | Primary: Family

## 2019-06-19 DIAGNOSIS — F988 Other specified behavioral and emotional disorders with onset usually occurring in childhood and adolescence: Secondary | ICD-10-CM

## 2019-06-19 MED ORDER — AMPHETAMINE-DEXTROAMPHETAMINE 20 MG TAB
20 mg | ORAL_TABLET | Freq: Every day | ORAL | 0 refills | Status: DC
Start: 2019-06-19 — End: 2019-07-23

## 2019-06-19 MED ORDER — AMITRIPTYLINE 100 MG TAB
100 mg | ORAL_TABLET | Freq: Every evening | ORAL | 0 refills | Status: DC | PRN
Start: 2019-06-19 — End: 2019-07-23

## 2019-06-19 MED ORDER — CLONAZEPAM 1 MG TAB
1 mg | ORAL_TABLET | Freq: Two times a day (BID) | ORAL | 0 refills | Status: DC | PRN
Start: 2019-06-19 — End: 2019-07-23
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
## 2019-07-01 NOTE — Telephone Encounter (Signed)
Patient is requesting to go back on the Chantix. Please advise and pend new Rx if appropriate.    Last Visit: 06/19/19 with NP Katrinka Blazing  Next Appointment: Advised to follow-up in 4 weeks  Previous Refill Encounter(s): last filled in 2018

## 2019-07-05 NOTE — Telephone Encounter (Signed)
Patient was scheduled for a OV, and contacted with not answer.

## 2019-07-19 NOTE — Telephone Encounter (Signed)
Pt called to schedule appt for medication refill

## 2019-07-23 ENCOUNTER — Encounter

## 2019-07-23 ENCOUNTER — Telehealth: Admit: 2019-07-23 | Payer: MEDICAID | Attending: Family | Primary: Family

## 2019-07-23 DIAGNOSIS — Z720 Tobacco use: Secondary | ICD-10-CM

## 2019-07-23 MED ORDER — AMPHETAMINE-DEXTROAMPHETAMINE 20 MG TAB
20 mg | ORAL_TABLET | Freq: Every day | ORAL | 0 refills | Status: DC
Start: 2019-07-23 — End: 2019-08-22

## 2019-07-23 MED ORDER — AMITRIPTYLINE 100 MG TAB
100 mg | ORAL_TABLET | Freq: Every evening | ORAL | 2 refills | Status: DC | PRN
Start: 2019-07-23 — End: 2020-05-14

## 2019-07-23 MED ORDER — VARENICLINE 0.5 MG (11)-1 MG (3X14) TABS IN A DOSE PACK
0.5 mg (11)- 1 mg (42) | ORAL | 0 refills | Status: DC
Start: 2019-07-23 — End: 2019-08-27

## 2019-07-23 MED ORDER — CLONAZEPAM 1 MG TAB
1 mg | ORAL_TABLET | Freq: Two times a day (BID) | ORAL | 0 refills | Status: DC | PRN
Start: 2019-07-23 — End: 2019-08-22

## 2019-07-23 NOTE — Progress Notes (Signed)
Lacey [name] is a 29 y.o. female who was seen by synchronous (real-time) audio-video technology on 07/23/2019 for No chief complaint on file.        Assessment & Plan:   Diagnoses and all orders for this visit:    1. Attention deficit disorder, unspecified hyperactivity presence  -     dextroamphetamine-amphetamine (ADDERALL) 20 mg tablet; Take 3 Tabs by mouth daily. Max Daily Amount: 60 mg.  -     REFERRAL TO PSYCHIATRY    2. Anxiety  -     clonazePAM (KlonoPIN) 1 mg tablet; Take 1 Tab by mouth two (2) times daily as needed for Anxiety. Max Daily Amount: 2 mg.  -     REFERRAL TO PSYCHIATRY    Other orders  -     amitriptyline (ELAVIL) 100 mg tablet; Take 0.5-1 Tabs by mouth nightly as needed for Sleep.      Given contact information for psychiatry.  Advised to keep scheduled follow-up.    I spent at least 15 minutes on this visit with this established patient.  [phone]  Subjective:   Patient requesting medication refill for anxiety.  States her previous psych provider does not take [facility] and new insurance does not start until 08/01/2019.  Comments with helping her children with virtual school a couple of days she was taking Adderall 3 times daily as she was previously to help stay focused.    Patient states she was taking previously prescribed Chantix to help stop smoking.  States she resumed smoking 06/16/2019 when she ran out of Chantix.  States she hadn't smoked since the end of Dec. 2020 when she resumed taking Chantix.    Prior to Admission medications    Medication Sig Start Date End Date Taking? Authorizing Provider   dextroamphetamine-amphetamine (ADDERALL) 20 mg tablet Take 1 Tab by mouth daily. Max Daily Amount: 20 mg. 06/19/19   Samari Gorby T, NP   amitriptyline (ELAVIL) 100 mg tablet Take 0.5-1 Tabs by mouth nightly as needed for Sleep. 06/19/19   Blessed Cotham T, NP   clonazePAM (KlonoPIN) 1 mg tablet Take 1 Tab by mouth two (2) times daily as needed for Anxiety. Max Daily Amount: 2 mg. 06/19/19    Tippi Mccrae T, NP   aluminum & magnesium hydroxide-simethicone (Mylanta Maximum Strength) 400-400-40 mg/5 mL suspension Take 10 mL by mouth every six (6) hours as needed for Indigestion. 03/10/19   Hendrick, Bryan P, PA-C   acetaminophen (TYLENOL) 325 mg tablet Take 2 Tabs by mouth every four (4) hours as needed for Pain. 11/19/18   Creekmore, Sarah, PA     Patient Active Problem List   Diagnosis Code   ??? Obesity, morbid (HCC) E66.01   ??? Anxiety F41.9   ??? Insomnia G47.00   ??? History of syncope Z87.898   ??? Abnormal weight gain R63.5     Patient Active Problem List    Diagnosis Date Noted   ??? Obesity, morbid (HCC) 08/16/2016   ??? Anxiety 08/16/2016   ??? Insomnia 08/16/2016   ??? History of syncope 08/16/2016   ??? Abnormal weight gain 08/16/2016     Current Outpatient Medications   Medication Sig Dispense Refill   ??? dextroamphetamine-amphetamine (ADDERALL) 20 mg tablet Take 1 Tab by mouth daily. Max Daily Amount: 20 mg. 30 Tab 0   ??? amitriptyline (ELAVIL) 100 mg tablet Take 0.5-1 Tabs by mouth nightly as needed for Sleep. 60 Tab 0   ??? clonazePAM (KlonoPIN) 1 mg tablet Take   1 Tab by mouth two (2) times daily as needed for Anxiety. Max Daily Amount: 2 mg. 30 Tab 0   ??? aluminum & magnesium hydroxide-simethicone (Mylanta Maximum Strength) 400-400-40 mg/5 mL suspension Take 10 mL by mouth every six (6) hours as needed for Indigestion. 150 mL 0   ??? acetaminophen (TYLENOL) 325 mg tablet Take 2 Tabs by mouth every four (4) hours as needed for Pain. 20 Tab 0     No Known Allergies  Past Medical History:   Diagnosis Date   ??? Anxiety    ??? Panic disorder      Past Surgical History:   Procedure Laterality Date   ??? HX CESAREAN SECTION     ??? HX GYN  2010    C Section     Family History   Problem Relation Age of Onset   ??? No Known Problems Mother      Social History     Tobacco Use   ??? Smoking status: Current Every Day Smoker     Packs/day: 0.25   ??? Smokeless tobacco: Current User   Substance Use Topics   ??? Alcohol use: No     Comment:  Social     Review of Systems   Constitutional: Negative for chills and fever.   Respiratory: Negative for shortness of breath.    Cardiovascular: Negative for chest pain and palpitations.   Neurological: Negative for dizziness and headaches.     Objective:   No flowsheet data found.   General: alert, cooperative, no distress   Mental  status: normal mood, behavior, speech, dress, motor activity, and thought processes, able to follow commands   HENT: NCAT   Neck: no visualized mass   Resp: no respiratory distress   Neuro: no gross deficits   Skin: no discoloration or lesions of concern on visible areas   Psychiatric: normal affect, consistent with stated mood, no evidence of hallucinations     Additional exam findings:       We discussed the expected course, resolution and complications of the diagnosis(es) in detail.  Medication risks, benefits, costs, interactions, and alternatives were discussed as indicated.  I advised her to contact the office if her condition worsens, changes or fails to improve as anticipated. She expressed understanding with the diagnosis(es) and plan.     Clarita Leber, was evaluated through a synchronous (real-time) audio-video encounter. The patient (or guardian if applicable) is aware that this is a billable service. Verbal consent to proceed has been obtained within the past 12 months. The visit was conducted pursuant to the emergency declaration under the [employer] and the [employer], 1135 waiver authority and the [employer] Act.  Patient identification was verified, and a caregiver was present when appropriate. The patient was located in a state where the provider was credentialed to provide care.    Jeralene Peters, NP

## 2019-08-09 ENCOUNTER — Encounter: Attending: Family | Primary: Family

## 2019-08-09 ENCOUNTER — Ambulatory Visit: Admit: 2019-08-09 | Payer: MEDICAID | Attending: Family | Primary: Family

## 2019-08-09 DIAGNOSIS — F419 Anxiety disorder, unspecified: Secondary | ICD-10-CM

## 2019-08-09 MED ORDER — BUPROPION SR 150 MG TAB
150 mg | ORAL_TABLET | ORAL | 2 refills | Status: AC
Start: 2019-08-09 — End: 2019-09-11

## 2019-08-09 NOTE — Patient Instructions (Addendum)
Anxiety Disorder: Care Instructions  Your Care Instructions     Anxiety is a normal reaction to stress. Difficult situations can cause you to have symptoms such as sweaty palms and a nervous feeling.  In an anxiety disorder, the symptoms are far more severe. Constant worry, muscle tension, trouble sleeping, nausea and diarrhea, and other symptoms can make normal daily activities difficult or impossible. These symptoms may occur for no reason, and they can affect your work, school, or social life. Medicines, counseling, and self-care can all help.  Follow-up care is a key part of your treatment and safety. Be sure to make and go to all appointments, and call your doctor if you are having problems. It's also a good idea to know your test results and keep a list of the medicines you take.  How can you care for yourself at home?  ?? Take medicines exactly as directed. Call your doctor if you think you are having a problem with your medicine.  ?? Go to your counseling sessions and follow-up appointments.  ?? Recognize and accept your anxiety. Then, when you are in a situation that makes you anxious, say to yourself, "This is not an emergency. I feel uncomfortable, but I am not in danger. I can keep going even if I feel anxious."  ?? Be kind to your body:  ? Relieve tension with exercise or a massage.  ? Get enough rest.  ? Avoid alcohol, caffeine, nicotine, and illegal drugs. They can increase your anxiety level and cause sleep problems.  ? Learn and do relaxation techniques. See below for more about these techniques.  ?? Engage your mind. Get out and do something you enjoy. Go to a funny movie, or take a walk or hike. Plan your day. Having too much or too little to do can make you anxious.  ?? Keep a record of your symptoms. Discuss your fears with a good friend or family member, or join a support group for people with similar problems. Talking to others sometimes relieves stress.  ?? Get involved in social groups, or  volunteer to help others. Being alone sometimes makes things seem worse than they are.  ?? Get at least 30 minutes of exercise on most days of the week to relieve stress. Walking is a good choice. You also may want to do other activities, such as running, swimming, cycling, or playing tennis or team sports.  Relaxation techniques  Do relaxation exercises 10 to 20 minutes a day. You can play soothing, relaxing music while you do them, if you wish.  ?? Tell others in your house that you are going to do your relaxation exercises. Ask them not to disturb you.  ?? Find a comfortable place, away from all distractions and noise.  ?? Lie down on your back, or sit with your back straight.  ?? Focus on your breathing. Make it slow and steady.  ?? Breathe in through your nose. Breathe out through either your nose or mouth.  ?? Breathe deeply, filling up the area between your navel and your rib cage. Breathe so that your belly goes up and down.  ?? Do not hold your breath.  ?? Breathe like this for 5 to 10 minutes. Notice the feeling of calmness throughout your whole body.  As you continue to breathe slowly and deeply, relax by doing the following for another 5 to 10 minutes:  ?? Tighten and relax each muscle group in your body. You can begin at your toes   and work your way up to your head.  ?? Imagine your muscle groups relaxing and becoming heavy.  ?? Empty your mind of all thoughts.  ?? Let yourself relax more and more deeply.  ?? Become aware of the state of calmness that surrounds you.  ?? When your relaxation time is over, you can bring yourself back to alertness by moving your fingers and toes and then your hands and feet and then stretching and moving your entire body. Sometimes people fall asleep during relaxation, but they usually wake up shortly afterward.  ?? Always give yourself time to return to full alertness before you drive a car or do anything that might cause an accident if you are not fully alert. Never play a relaxation  tape while you drive a car.  When should you call for help?   Call 911 anytime you think you may need emergency care. For example, call if:  ?? ?? You feel you cannot stop from hurting yourself or someone else.   Keep the numbers for these national suicide hotlines: 1-800-273-TALK (1-800-273-8255) and 1-800-SUICIDE (1-800-784-2433). If you or someone you know talks about suicide or feeling hopeless, get help right away.  Watch closely for changes in your health, and be sure to contact your doctor if:  ?? ?? You have anxiety or fear that affects your life.   ?? ?? You have symptoms of anxiety that are new or different from those you had before.   Where can you learn more?  Go to https://www.healthwise.net/[facility]  Enter P754 in the search box to learn more about "Anxiety Disorder: Care Instructions."  Current as of: January 23, 2019??????????????????????????????Content Version: 12.8  ?? 2006-2021 [employer]wise[employer] Inc[employer].   Care instructions adapted under license by [employer] [facility] (which disclaims liability or warranty for this information). If you have questions about a medical condition or this instruction, always ask your healthcare professional. Healthwise, Incorporated disclaims any warranty or liability for your use of this information.

## 2019-08-09 NOTE — Progress Notes (Signed)
Lacey [name] is a 29 y.o. female who was seen in clinic today (08/09/2019) for Anxiety and Insomnia  .      Assessment & Plan:   Diagnoses and all orders for this visit:    1. Anxiety    2. Attention deficit disorder, unspecified hyperactivity presence    3. Encounter for long-term (current) use of high-risk medication  -     COMPLIANCE DRUG SCREEN/PRESCRIPTION MONITORING; Future    4. Tobacco abuse    Other orders  -     buPROPion SR (WELLBUTRIN SR) 150 mg SR tablet; Take 1 Tab by mouth daily for 3 days, THEN 1 Tab two (2) times a day for 30 days.        I have discussed the diagnosis with the patient and the intended plan as seen in the above orders.  The patient has received an after-visit summary and questions were answered concerning future plans.  I have discussed medication side effects and warnings with the patient as well. Patient agreeable with above plan and verbalizes understanding.    Follow-up and Dispositions    ?? Return in about 4 weeks (around 09/06/2019) for anxiety/depression/tobacco abuse, virtual follow up.       Subjective:   Tobacco abuse: patient states she is tolerating chantix without any adverse effects.  Reports she is currently smoking 1/2ppd, previously was smoking 1ppd.  Patient states her wife was released from jail 2 days ago and she has been having increased anxiety/depression.  Requesting medication for symptoms.  Reports she did have to wait for her new insurance to begin prior to scheduling an appointment with psych.  Has an upcoming appt scheduled, doesn't recall the exact date.    3 most recent PHQ Screens 08/09/2019 07/23/2019 04/13/2017   Little interest or pleasure in doing things Several days Not at all Not at all   Feeling down, depressed, irritable, or hopeless More than half the days Not at all Several days   Total Score PHQ 2 3 0 1   Trouble falling or staying asleep, or sleeping too much Not at all Not at all -   Feeling tired or having little energy Nearly every day  Not at all -   Poor appetite, weight loss, or overeating Several days Not at all -   Feeling bad about yourself - or that you are a failure or have let yourself or your family down More than half the days Not at all -   Trouble concentrating on things such as school, work, reading, or watching TV Nearly every day Not at all -   Moving or speaking so slowly that other people could have noticed; or the opposite being so fidgety that others notice Not at all Not at all -   Thoughts of being better off dead, or hurting yourself in some way Not at all Not at all -   PHQ 9 Score 12 0 -   How difficult have these problems made it for you to do your work, take care of your home and get along with others Somewhat difficult Not difficult at all -     GAD 2/7 08/09/2019   Feeling nervous, anxious or on edge? 3   Not being able to stop or control worrying? 3   GAD-2 Subtotal 6   Worrying too much about different things? 3   Trouble relaxing? 3   Being so restless that it is hard to sit still? 3   Becoming easily  annoyed or irritable? 3   Feeling afraid as if something awful might happen? 3   GAD-7 Total Score 21   If you checked off any problems, how difficult have these problems made it for you to do your work, take care of thinks at home, or get along with other people?  Very difficult     Lab Results   Component Value Date/Time    Sodium 140 03/10/2019 07:40 PM    Potassium 4.6 03/10/2019 07:40 PM    Chloride 106 03/10/2019 07:40 PM    CO2 26 03/10/2019 07:40 PM    Anion gap 8 03/10/2019 07:40 PM    Glucose 43 (LL) 03/10/2019 07:40 PM    BUN 18 03/10/2019 07:40 PM    Creatinine 1.0 03/10/2019 07:40 PM    BUN/Creatinine ratio 13 07/23/2009 07:40 PM    GFR est AA >60.0 03/10/2019 07:40 PM    GFR est non-AA >60 03/10/2019 07:40 PM    Calcium 10.0 03/10/2019 07:40 PM    Bilirubin, total 0.2 03/17/2018 10:05 PM    Alk. phosphatase 45 03/17/2018 10:05 PM    Protein, total 6.8 03/17/2018 10:05 PM    Albumin 2.9 (L) 03/17/2018 10:05  PM    Globulin 3.5 11/25/2008 07:00 AM    A-G Ratio 0.6 (L) 11/25/2008 07:00 AM    ALT (SGPT) 15 03/17/2018 10:05 PM    AST (SGOT) 20 03/17/2018 10:05 PM     Lab Results   Component Value Date/Time    WBC 7.4 03/10/2019 07:40 PM    HGB 12.1 (L) 03/10/2019 07:40 PM    HCT 37.2 03/10/2019 07:40 PM    PLATELET 254 03/10/2019 07:40 PM    MCV 92.8 03/10/2019 07:40 PM     Wt Readings from Last 3 Encounters:   08/09/19 203 lb (92.1 kg)   03/10/19 179 lb (81.2 kg)   02/03/19 189 lb (85.7 kg)     Temp Readings from Last 3 Encounters:   08/09/19 97.9 ??F (36.6 ??C) (Temporal)   03/10/19 98.2 ??F (36.8 ??C)   02/03/19 98 ??F (36.7 ??C)     BP Readings from Last 3 Encounters:   08/09/19 111/68   03/10/19 121/81   02/03/19 132/67     Pulse Readings from Last 3 Encounters:   08/09/19 100   03/10/19 (!) 101   02/03/19 89       Prior to Admission medications    Medication Sig Start Date End Date Taking? Authorizing Provider   dextroamphetamine-amphetamine (ADDERALL) 20 mg tablet Take 3 Tabs by mouth daily. Max Daily Amount: 60 mg. 07/23/19  Yes Rita Ohara T, NP   clonazePAM (KlonoPIN) 1 mg tablet Take 1 Tab by mouth two (2) times daily as needed for Anxiety. Max Daily Amount: 2 mg. 07/23/19  Yes Rita Ohara T, NP   amitriptyline (ELAVIL) 100 mg tablet Take 0.5-1 Tabs by mouth nightly as needed for Sleep. 07/23/19  Yes Rita Ohara T, NP   varenicline (CHANTIX STARTER PAK) 0.5 mg (11)- 1 mg (42) DsPk Take as directed on dose pack 07/23/19  Yes Rita Ohara T, NP   aluminum & magnesium hydroxide-simethicone (Mylanta Maximum Strength) 400-400-40 mg/5 mL suspension Take 10 mL by mouth every six (6) hours as needed for Indigestion. 03/10/19   Chesley Noon, PA-C   acetaminophen (TYLENOL) 325 mg tablet Take 2 Tabs by mouth every four (4) hours as needed for Pain. 11/19/18   Kriste Basque, PA       The following  sections were reviewed & updated as appropriate: PMH, PSH, FH, and SH.      Review of Systems   Constitutional: Negative for  activity change, appetite change, chills, fatigue and fever.   Respiratory: Negative for chest tightness and shortness of breath.    Cardiovascular: Negative for chest pain.   Neurological: Negative for dizziness and headaches.     Objective:     Visit Vitals  BP 111/68 (BP 1 Location: Left arm, BP Patient Position: Sitting, BP Cuff Size: Adult)   Pulse 100   Temp 97.9 ??F (36.6 ??C) (Temporal)   Resp 18   Ht 5' 4" (1.626 m)   Wt 203 lb (92.1 kg)   LMP 08/05/2019   SpO2 98%   BMI 34.84 kg/m??      Physical Exam  Constitutional:       General: She is not in acute distress.     Appearance: She is well-developed.   HENT:      Head: Normocephalic and atraumatic.   Neck:      Musculoskeletal: Normal range of motion and neck supple.      Vascular: No carotid bruit.   Cardiovascular:      Rate and Rhythm: Normal rate and regular rhythm.      Heart sounds: Normal heart sounds. No murmur. No friction rub. No gallop.    Pulmonary:      Effort: Pulmonary effort is normal.      Breath sounds: Normal breath sounds. No decreased breath sounds, wheezing, rhonchi or rales.   Abdominal:      General: Bowel sounds are normal.      Palpations: Abdomen is soft.      Tenderness: There is no abdominal tenderness.   Lymphadenopathy:      Cervical: No cervical adenopathy.   Skin:     General: Skin is warm and dry.   Neurological:      Mental Status: She is alert and oriented to person, place, and time.         Disclaimer:    The patient understands our medical plan.  Alternatives have been explained and offered.  The risks, benefits and significant side effects of all medications have been reviewed. Anticipated time course and progression of condition reviewed. All questions have been addressed.  She is encouraged to employ the information provided in the after visit summary, which was reviewed.      Where applicable, she is instructed to call the clinic if she has not been notified either by phone or through [facility] with the results of her  tests or with an appointment plan for any referrals within 1 week(s).  No news is not good news; it's no news. The patient  is to call if her condition worsens or fails to improve or if significant side effects are experienced.     Aspects of this note may have been generated using voice recognition software. Despite editing, there may be unrecognized errors.       Danley Danker, NP

## 2019-08-14 LAB — COMPLIANCE DRUG SCREEN/PRESCRIPTION MONITORING

## 2019-08-22 ENCOUNTER — Encounter

## 2019-08-22 NOTE — Telephone Encounter (Signed)
Patient states she has an appointment with [facility] in a couple of weeks. She is asking if NP Katrinka Blazing will refill these to bridge her until then.    [place] the last fill date for Adderall as 07/23/19 for a 30 d/s & Klonopin as 07/23/19 for a 15 d/s.     Last Visit: 08/09/19 with NP Katrinka Blazing  Next Appointment: Advised to follow-up in 4 weeks  Previous Refill Encounter(s): 07/23/19 Klonopin #30, Chantix #1, Adderall #90    Requested Prescriptions     Pending Prescriptions Disp Refills   ??? clonazePAM (KlonoPIN) 1 mg tablet 30 Tab 0     Sig: Take 1 Tab by mouth two (2) times daily as needed for Anxiety. Max Daily Amount: 2 mg.   ??? dextroamphetamine-amphetamine (ADDERALL) 20 mg tablet 90 Tab 0     Sig: Take 3 Tabs by mouth daily. Max Daily Amount: 60 mg.   ??? varenicline (CHANTIX STARTER PAK) 0.5 mg (11)- 1 mg (42) DsPk 1 Dose Pack 0     Sig: Take as directed on dose pack

## 2019-08-27 MED ORDER — CLONAZEPAM 1 MG TAB
1 mg | ORAL_TABLET | Freq: Two times a day (BID) | ORAL | 0 refills | Status: DC | PRN
Start: 2019-08-27 — End: 2020-05-14

## 2019-08-27 MED ORDER — AMPHETAMINE-DEXTROAMPHETAMINE 20 MG TAB
20 mg | ORAL_TABLET | Freq: Every day | ORAL | 0 refills | Status: DC
Start: 2019-08-27 — End: 2020-05-14

## 2019-08-27 MED ORDER — VARENICLINE 1 MG TAB
1 mg | ORAL_TABLET | Freq: Two times a day (BID) | ORAL | 2 refills | Status: DC
Start: 2019-08-27 — End: 2019-11-06

## 2019-11-06 DIAGNOSIS — N12 Tubulo-interstitial nephritis, not specified as acute or chronic: Secondary | ICD-10-CM

## 2019-11-07 ENCOUNTER — Inpatient Hospital Stay
Admit: 2019-11-07 | Discharge: 2019-11-07 | Disposition: A | Discharge status: Home / Self Care | Payer: MEDICAID | Attending: Emergency Medicine

## 2019-11-07 ENCOUNTER — Emergency Department: Admit: 2019-11-07 | Payer: MEDICAID | Primary: Family

## 2019-11-07 LAB — URINE MICROSCOPIC ONLY: RBC: 0 /hpf (ref 0–5)

## 2019-11-07 LAB — CBC WITH AUTOMATED DIFF
ABS. BASOPHILS: 0 10*3/uL (ref 0.0–0.1)
ABS. EOSINOPHILS: 0.1 10*3/uL (ref 0.0–0.4)
ABS. LYMPHOCYTES: 0.9 10*3/uL (ref 0.9–3.6)
ABS. MONOCYTES: 0.8 10*3/uL (ref 0.05–1.2)
ABS. NEUTROPHILS: 5.6 10*3/uL (ref 1.8–8.0)
BASOPHILS: 0 % (ref 0–2)
EOSINOPHILS: 1 % (ref 0–5)
HCT: 35.9 % (ref 35.0–45.0)
HGB: 11.5 g/dL — ABNORMAL LOW (ref 12.0–16.0)
LYMPHOCYTES: 12 % — ABNORMAL LOW (ref 21–52)
MCH: 28 PG (ref 24.0–34.0)
MCHC: 32 g/dL (ref 31.0–37.0)
MCV: 87.6 FL (ref 74.0–97.0)
MONOCYTES: 11 % — ABNORMAL HIGH (ref 3–10)
MPV: 10.2 FL (ref 9.2–11.8)
NEUTROPHILS: 76 % — ABNORMAL HIGH (ref 40–73)
PLATELET: 231 10*3/uL (ref 135–420)
RBC: 4.1 M/uL — ABNORMAL LOW (ref 4.20–5.30)
RDW: 15.9 % — ABNORMAL HIGH (ref 11.6–14.5)
WBC: 7.3 10*3/uL (ref 4.6–13.2)

## 2019-11-07 LAB — URINALYSIS W/ RFLX MICROSCOPIC
Bilirubin: NEGATIVE
Glucose: NEGATIVE mg/dL
Ketone: NEGATIVE mg/dL
Nitrites: NEGATIVE
Protein: NEGATIVE mg/dL
Specific gravity: 1.005 (ref 1.003–1.030)
Urobilinogen: 0.2 EU/dL (ref 0.2–1.0)
pH (UA): 5.5 (ref 5.0–8.0)

## 2019-11-07 LAB — METABOLIC PANEL, COMPREHENSIVE
A-G Ratio: 0.8 (ref 0.8–1.7)
ALT (SGPT): 17 U/L (ref 13–56)
AST (SGOT): 20 U/L (ref 10–38)
Albumin: 3 g/dL — ABNORMAL LOW (ref 3.4–5.0)
Alk. phosphatase: 81 U/L (ref 45–117)
Anion gap: 7 mmol/L (ref 3.0–18)
BUN/Creatinine ratio: 12 (ref 12–20)
BUN: 19 MG/DL — ABNORMAL HIGH (ref 7.0–18)
Bilirubin, total: 0.3 MG/DL (ref 0.2–1.0)
CO2: 32 mmol/L (ref 21–32)
Calcium: 8.6 MG/DL (ref 8.5–10.1)
Chloride: 91 mmol/L — ABNORMAL LOW (ref 100–111)
Creatinine: 1.64 MG/DL — ABNORMAL HIGH (ref 0.6–1.3)
GFR est AA: 44 mL/min/{1.73_m2} — ABNORMAL LOW (ref >60)
GFR est non-AA: 37 mL/min/{1.73_m2} — ABNORMAL LOW (ref >60)
Globulin: 3.7 g/dL (ref 2.0–4.0)
Glucose: 81 mg/dL (ref 74–99)
Potassium: 3 mmol/L — ABNORMAL LOW (ref 3.5–5.5)
Protein, total: 6.7 g/dL (ref 6.4–8.2)
Sodium: 130 mmol/L — ABNORMAL LOW (ref 136–145)

## 2019-11-07 LAB — RESPIRATORY VIRUS PANEL W/COVID-19, PCR
Adenovirus: NOT DETECTED
B. parapertussis, PCR: NOT DETECTED
Bordetella pertussis - PCR: NOT DETECTED
Chlamydophila pneumoniae DNA, QL, PCR: NOT DETECTED
Coronavirus 229E: NOT DETECTED
Coronavirus CVNL63: NOT DETECTED
Coronavirus HKU1: NOT DETECTED
Coronavirus OC43: NOT DETECTED
Influenza A: NOT DETECTED
Influenza B: NOT DETECTED
Metapneumovirus: NOT DETECTED
Mycoplasma pneumoniae DNA, QL, PCR: NOT DETECTED
Parainfluenza 1: NOT DETECTED
Parainfluenza 2: NOT DETECTED
Parainfluenza 3: NOT DETECTED
Parainfluenza virus 4: NOT DETECTED
RSV by PCR: NOT DETECTED
Rhinovirus and Enterovirus: NOT DETECTED
SARS-CoV-2, PCR: NOT DETECTED

## 2019-11-07 LAB — HCG QL SERUM: HCG, Ql.: NEGATIVE

## 2019-11-07 MED ORDER — CEFTRIAXONE 1 GRAM SOLUTION FOR INJECTION
1 gram | Freq: Once | INTRAMUSCULAR | Status: AC
Start: 2019-11-07 — End: 2019-11-07
  Administered 2019-11-07: 08:00:00 via INTRAVENOUS

## 2019-11-07 MED ORDER — SODIUM CHLORIDE 0.9% BOLUS IV
0.9 % | Freq: Once | INTRAVENOUS | Status: AC
Start: 2019-11-07 — End: 2019-11-07
  Administered 2019-11-07: 04:00:00 via INTRAVENOUS

## 2019-11-07 MED ORDER — DICYCLOMINE 10 MG CAP
10 mg | ORAL | Status: AC
Start: 2019-11-07 — End: 2019-11-07
  Administered 2019-11-07: 04:00:00 via ORAL

## 2019-11-07 MED ORDER — IOPAMIDOL 61 % IV SOLN
300 mg iodine /mL (61 %) | Freq: Once | INTRAVENOUS | Status: AC
Start: 2019-11-07 — End: 2019-11-07
  Administered 2019-11-07: 06:00:00 via INTRAVENOUS

## 2019-11-07 MED ORDER — HYDROCODONE-ACETAMINOPHEN 5 MG-325 MG TAB
5-325 mg | ORAL_TABLET | ORAL | 0 refills | Status: AC | PRN
Start: 2019-11-07 — End: 2019-11-10

## 2019-11-07 MED ORDER — ONDANSETRON (PF) 4 MG/2 ML INJECTION
4 mg/2 mL | INTRAMUSCULAR | Status: AC
Start: 2019-11-07 — End: 2019-11-07
  Administered 2019-11-07: 04:00:00 via INTRAVENOUS

## 2019-11-07 MED ORDER — CIPROFLOXACIN 500 MG TAB
500 mg | ORAL_TABLET | Freq: Two times a day (BID) | ORAL | 0 refills | Status: AC
Start: 2019-11-07 — End: 2019-11-14

## 2019-11-07 MED ORDER — IBUPROFEN 600 MG TAB
600 mg | ORAL_TABLET | Freq: Four times a day (QID) | ORAL | 0 refills | Status: DC | PRN
Start: 2019-11-07 — End: 2020-03-17

## 2019-11-07 MED ORDER — KETOROLAC TROMETHAMINE 30 MG/ML INJECTION
30 mg/mL (1 mL) | INTRAMUSCULAR | Status: AC
Start: 2019-11-07 — End: 2019-11-07
  Administered 2019-11-07: 04:00:00 via INTRAVENOUS

## 2019-11-07 MED FILL — DICYCLOMINE 10 MG CAP: 10 mg | ORAL | Qty: 2

## 2019-11-07 MED FILL — SODIUM CHLORIDE 0.9 % IV: INTRAVENOUS | Qty: 1000

## 2019-11-07 MED FILL — CEFTRIAXONE 1 GRAM SOLUTION FOR INJECTION: 1 gram | INTRAMUSCULAR | Qty: 2

## 2019-11-07 MED FILL — ISOVUE-300  61 % INTRAVENOUS SOLUTION: 300 mg iodine /mL (61 %) | INTRAVENOUS | Qty: 100

## 2019-11-07 MED FILL — KETOROLAC TROMETHAMINE 30 MG/ML INJECTION: 30 mg/mL (1 mL) | INTRAMUSCULAR | Qty: 1

## 2019-11-07 MED FILL — ONDANSETRON (PF) 4 MG/2 ML INJECTION: 4 mg/2 mL | INTRAMUSCULAR | Qty: 2

## 2020-01-22 ENCOUNTER — Telehealth: Admit: 2020-01-22 | Payer: MEDICAID | Attending: Family | Primary: Family

## 2020-01-22 DIAGNOSIS — R079 Chest pain, unspecified: Secondary | ICD-10-CM

## 2020-02-13 ENCOUNTER — Ambulatory Visit: Attending: Acute Care | Primary: Family

## 2020-03-09 ENCOUNTER — Encounter: Payer: MEDICAID | Attending: Acute Care | Primary: Family

## 2020-03-17 ENCOUNTER — Encounter

## 2020-03-17 ENCOUNTER — Ambulatory Visit: Admit: 2020-03-17 | Discharge: 2020-03-17 | Payer: MEDICAID | Attending: Nurse Practitioner | Primary: Family

## 2020-03-17 DIAGNOSIS — R4020 Unspecified coma: Secondary | ICD-10-CM

## 2020-03-23 ENCOUNTER — Ambulatory Visit: Payer: MEDICAID | Primary: Family

## 2020-03-24 ENCOUNTER — Ambulatory Visit: Payer: MEDICAID | Primary: Family

## 2020-04-14 ENCOUNTER — Inpatient Hospital Stay: Admit: 2020-04-14 | Payer: MEDICAID | Attending: Nurse Practitioner | Primary: Family

## 2020-04-14 DIAGNOSIS — R4020 Unspecified coma: Secondary | ICD-10-CM

## 2020-04-15 ENCOUNTER — Ambulatory Visit: Payer: MEDICAID | Primary: Family

## 2020-05-14 ENCOUNTER — Encounter

## 2020-05-19 MED ORDER — CLONAZEPAM 1 MG TAB
1 mg | ORAL_TABLET | Freq: Two times a day (BID) | ORAL | 0 refills | Status: DC | PRN
Start: 2020-05-19 — End: 2020-06-10

## 2020-05-19 MED ORDER — AMPHETAMINE-DEXTROAMPHETAMINE 20 MG TAB
20 mg | ORAL_TABLET | Freq: Every day | ORAL | 0 refills | Status: DC
Start: 2020-05-19 — End: 2020-06-10

## 2020-05-19 MED ORDER — AMITRIPTYLINE 100 MG TAB
100 mg | ORAL_TABLET | Freq: Every evening | ORAL | 2 refills | Status: DC | PRN
Start: 2020-05-19 — End: 2020-06-26

## 2020-05-21 ENCOUNTER — Telehealth: Admit: 2020-05-21 | Payer: MEDICAID | Attending: Family | Primary: Family

## 2020-05-21 DIAGNOSIS — Z20822 Contact with and (suspected) exposure to covid-19: Secondary | ICD-10-CM

## 2020-05-21 MED ORDER — PROMETHAZINE-CODEINE 6.25 MG-10 MG/5 ML SYRUP
Freq: Four times a day (QID) | ORAL | 0 refills | Status: AC | PRN
Start: 2020-05-21 — End: 2020-05-28

## 2020-06-10 ENCOUNTER — Telehealth: Admit: 2020-06-10 | Discharge: 2020-06-10 | Payer: MEDICAID | Attending: Family | Primary: Family

## 2020-06-10 DIAGNOSIS — R079 Chest pain, unspecified: Secondary | ICD-10-CM

## 2020-06-10 MED ORDER — CLONAZEPAM 2 MG TAB
2 mg | ORAL_TABLET | Freq: Three times a day (TID) | ORAL | 0 refills | Status: DC | PRN
Start: 2020-06-10 — End: 2020-07-28

## 2020-06-10 MED ORDER — AMPHETAMINE-DEXTROAMPHETAMINE 20 MG TAB
20 mg | ORAL_TABLET | Freq: Every day | ORAL | 0 refills | Status: DC
Start: 2020-06-10 — End: 2020-07-29

## 2020-06-18 ENCOUNTER — Encounter: Attending: Family | Primary: Family

## 2020-06-26 ENCOUNTER — Encounter

## 2020-07-01 ENCOUNTER — Telehealth: Admit: 2020-07-01 | Discharge: 2020-07-01 | Payer: MEDICAID | Attending: Family | Primary: Family

## 2020-07-01 DIAGNOSIS — M2550 Pain in unspecified joint: Secondary | ICD-10-CM

## 2020-07-01 MED ORDER — ETODOLAC 400 MG TAB
400 mg | ORAL_TABLET | Freq: Two times a day (BID) | ORAL | 1 refills | Status: DC
Start: 2020-07-01 — End: 2020-08-27

## 2020-07-01 MED ORDER — AMITRIPTYLINE 100 MG TAB
100 mg | ORAL_TABLET | Freq: Every evening | ORAL | 2 refills | Status: DC | PRN
Start: 2020-07-01 — End: 2020-08-27

## 2020-07-08 ENCOUNTER — Inpatient Hospital Stay: Admit: 2020-07-08 | Primary: Family

## 2020-07-08 LAB — LABCORP SPECIMEN COLLECTION

## 2020-07-09 ENCOUNTER — Encounter

## 2020-07-17 LAB — CBC WITH AUTOMATED DIFF
ABS. BASOPHILS: 0 10*3/uL (ref 0.0–0.2)
ABS. EOSINOPHILS: 0.2 10*3/uL (ref 0.0–0.4)
ABS. IMM. GRANS.: 0 10*3/uL (ref 0.0–0.1)
ABS. MONOCYTES: 0.5 10*3/uL (ref 0.1–0.9)
ABS. NEUTROPHILS: 5.1 10*3/uL (ref 1.4–7.0)
Abs Lymphocytes: 2.2 10*3/uL (ref 0.7–3.1)
BASOPHILS: 0 %
EOSINOPHILS: 2 %
HCT: 40.4 % (ref 34.0–46.6)
HGB: 13 g/dL (ref 11.1–15.9)
IMMATURE GRANULOCYTES: 0 %
Lymphocytes: 28 %
MCH: 28.8 pg (ref 26.6–33.0)
MCHC: 32.2 g/dL (ref 31.5–35.7)
MCV: 89 fL (ref 79–97)
MONOCYTES: 6 %
NEUTROPHILS: 64 %
PLATELET: 361 10*3/uL (ref 150–450)
RBC: 4.52 x10E6/uL (ref 3.77–5.28)
RDW: 15.6 % — ABNORMAL HIGH (ref 11.7–15.4)
WBC: 8 10*3/uL (ref 3.4–10.8)

## 2020-07-17 LAB — CK: Creatine Kinase,Total: 72 U/L (ref 32–182)

## 2020-07-17 LAB — METABOLIC PANEL, COMPREHENSIVE
A-G Ratio: 1.5 (ref 1.2–2.2)
ALT (SGPT): 10 IU/L (ref 0–32)
AST (SGOT): 16 IU/L (ref 0–40)
Albumin: 4.3 g/dL (ref 3.8–4.8)
Alk. phosphatase: 55 IU/L (ref 44–121)
BUN/Creatinine ratio: 14 (ref 9–23)
BUN: 11 mg/dL (ref 6–20)
Bilirubin, total: <0.2 mg/dL (ref 0.0–1.2)
CO2: 18 mmol/L — ABNORMAL LOW (ref 20–29)
Calcium: 8.8 mg/dL (ref 8.7–10.2)
Chloride: 106 mmol/L (ref 96–106)
Creatinine: 0.77 mg/dL (ref 0.57–1.00)
GLOBULIN, TOTAL: 2.9 g/dL (ref 1.5–4.5)
Glucose: 93 mg/dL (ref 65–99)
Potassium: 5.2 mmol/L (ref 3.5–5.2)
Protein, total: 7.2 g/dL (ref 6.0–8.5)
Sodium: 140 mmol/L (ref 134–144)
eGFR: 106 mL/min/{1.73_m2} (ref >59)

## 2020-07-17 LAB — ANA COMPREHENSIVE PANEL
Anti-Jo-1: <0.2 AI (ref 0.0–0.9)
Antichromatin Ab: <0.2 AI (ref 0.0–0.9)
Centromere B Ab: <0.2 AI (ref 0.0–0.9)
Scleroderma-70 Ab: <0.2 AI (ref 0.0–0.9)
Sjogren's Anti-SS-A: <0.2 AI (ref 0.0–0.9)
Sjogren's Anti-SS-B: <0.2 AI (ref 0.0–0.9)

## 2020-07-17 LAB — LIPID PANEL
Cholesterol, total: 183 mg/dL (ref 100–199)
HDL Cholesterol: 58 mg/dL (ref >39)
LDL, calculated: 105 mg/dL — ABNORMAL HIGH (ref 0–99)
Triglyceride: 115 mg/dL (ref 0–149)
VLDL, calculated: 20 mg/dL (ref 5–40)

## 2020-07-17 LAB — RHEUMASSURE
14.3.3 ETA, Rheum. Arthritis: <0.2 ng/mL
CCP Antibodies IgG/IgA: <20 Units
Rheumatoid Arthritis Factor: <14 Units/mL

## 2020-07-17 LAB — LUPUS PROFILE
Anti-DNA (DS) Ab, QT: <1 IU/mL (ref 0–9)
Antinuclear Antibodies Direct: NEGATIVE
RNP Abs: <0.2 AI (ref 0.0–0.9)
Smith Abs: <0.2 AI (ref 0.0–0.9)

## 2020-07-17 LAB — HEMOGLOBIN A1C WITH EAG
Estimated average glucose: 100 mg/dL
Hemoglobin A1c: 5.1 % (ref 4.8–5.6)

## 2020-07-17 LAB — VITAMIN D, 25 HYDROXY: VITAMIN D, 25-HYDROXY: 26.5 ng/mL — ABNORMAL LOW (ref 30.0–100.0)

## 2020-07-17 LAB — SED RATE (ESR): Sed rate (ESR): 37 mm/hr — ABNORMAL HIGH (ref 0–32)

## 2020-07-17 LAB — C REACTIVE PROTEIN, QT: C-Reactive Protein, Qt: <1 mg/L (ref 0–10)

## 2020-07-20 ENCOUNTER — Encounter

## 2020-07-20 MED ORDER — ERGOCALCIFEROL (VITAMIN D2) 50,000 UNIT CAP
1250 mcg (50,000 unit) | ORAL_CAPSULE | ORAL | 0 refills | Status: AC
Start: 2020-07-20 — End: 2020-09-08

## 2020-07-22 ENCOUNTER — Encounter

## 2020-07-28 ENCOUNTER — Encounter

## 2020-07-28 MED ORDER — CLONAZEPAM 2 MG TAB
2 mg | ORAL_TABLET | ORAL | 0 refills | Status: DC
Start: 2020-07-28 — End: 2020-08-27

## 2020-07-29 MED ORDER — AMPHETAMINE-DEXTROAMPHETAMINE 20 MG TAB
20 mg | ORAL_TABLET | ORAL | 0 refills | Status: DC
Start: 2020-07-29 — End: 2020-08-27

## 2020-08-27 ENCOUNTER — Encounter

## 2020-08-31 MED ORDER — AMPHETAMINE-DEXTROAMPHETAMINE 20 MG TAB
20 mg | ORAL_TABLET | Freq: Every day | ORAL | 0 refills | Status: DC
Start: 2020-08-31 — End: 2020-09-30

## 2020-08-31 MED ORDER — AMITRIPTYLINE 100 MG TAB
100 mg | ORAL_TABLET | Freq: Every evening | ORAL | 2 refills | Status: DC | PRN
Start: 2020-08-31 — End: 2020-09-30

## 2020-08-31 MED ORDER — ETODOLAC 400 MG TAB
400 mg | ORAL_TABLET | Freq: Two times a day (BID) | ORAL | 1 refills | Status: DC
Start: 2020-08-31 — End: 2020-09-30

## 2020-08-31 MED ORDER — CLONAZEPAM 2 MG TAB
2 mg | ORAL_TABLET | Freq: Three times a day (TID) | ORAL | 0 refills | Status: DC | PRN
Start: 2020-08-31 — End: 2020-09-30

## 2020-09-30 ENCOUNTER — Telehealth: Admit: 2020-09-30 | Payer: MEDICAID | Attending: Family | Primary: Family

## 2020-09-30 DIAGNOSIS — F419 Anxiety disorder, unspecified: Secondary | ICD-10-CM

## 2020-09-30 MED ORDER — AMITRIPTYLINE 100 MG TAB
100 mg | ORAL_TABLET | Freq: Every evening | ORAL | 0 refills | Status: AC | PRN
Start: 2020-09-30 — End: ?

## 2020-09-30 MED ORDER — ALBUTEROL SULFATE 0.083 % (0.83 MG/ML) SOLN FOR INHALATION
2.5 mg /3 mL (0.083 %) | INHALATION_SOLUTION | Freq: Four times a day (QID) | RESPIRATORY_TRACT | 0 refills | Status: AC | PRN
Start: 2020-09-30 — End: ?

## 2020-09-30 MED ORDER — AMPHETAMINE-DEXTROAMPHETAMINE 30 MG TAB
30 mg | ORAL_TABLET | Freq: Every day | ORAL | 0 refills | Status: DC
Start: 2020-09-30 — End: 2021-01-26

## 2020-09-30 MED ORDER — ETODOLAC 400 MG TAB
400 mg | ORAL_TABLET | Freq: Two times a day (BID) | ORAL | 0 refills | Status: AC
Start: 2020-09-30 — End: ?

## 2020-09-30 MED ORDER — ALBUTEROL SULFATE HFA 90 MCG/ACTUATION AEROSOL INHALER
90 mcg/actuation | RESPIRATORY_TRACT | 1 refills | Status: AC | PRN
Start: 2020-09-30 — End: ?

## 2020-09-30 MED ORDER — NEBULIZER & COMPRESSOR
0 refills | Status: AC | PRN
Start: 2020-09-30 — End: ?

## 2020-09-30 MED ORDER — NEBULIZER ACCESSORIES KIT
PACK | 0 refills | Status: AC
Start: 2020-09-30 — End: ?

## 2020-09-30 MED ORDER — CLONAZEPAM 2 MG TAB
2 mg | ORAL_TABLET | Freq: Three times a day (TID) | ORAL | 2 refills | Status: DC | PRN
Start: 2020-09-30 — End: 2021-01-26

## 2020-12-03 ENCOUNTER — Encounter

## 2021-01-26 ENCOUNTER — Encounter

## 2021-01-31 MED ORDER — AMPHETAMINE-DEXTROAMPHETAMINE 30 MG TAB
30 mg | ORAL_TABLET | Freq: Every day | ORAL | 0 refills | Status: AC
Start: 2021-01-31 — End: ?

## 2021-01-31 MED ORDER — CLONAZEPAM 2 MG TAB
2 mg | ORAL_TABLET | Freq: Three times a day (TID) | ORAL | 2 refills | Status: AC | PRN
Start: 2021-01-31 — End: ?
